# Patient Record
Sex: Female | Born: 2000 | Race: White | Hispanic: No | Marital: Single | Smoking: Never smoker
Health system: Southern US, Community
[De-identification: ages and names within clinical notes are randomized; demographics above are authoritative.]

## PROBLEM LIST (undated history)

## (undated) DIAGNOSIS — F419 Anxiety disorder, unspecified: Secondary | ICD-10-CM

## (undated) DIAGNOSIS — K219 Gastro-esophageal reflux disease without esophagitis: Secondary | ICD-10-CM

## (undated) DIAGNOSIS — N390 Urinary tract infection, site not specified: Secondary | ICD-10-CM

## (undated) DIAGNOSIS — F32A Depression, unspecified: Secondary | ICD-10-CM

## (undated) DIAGNOSIS — F329 Major depressive disorder, single episode, unspecified: Secondary | ICD-10-CM

## (undated) HISTORY — DX: Gastro-esophageal reflux disease without esophagitis: K21.9

## (undated) HISTORY — DX: Major depressive disorder, single episode, unspecified: F32.9

## (undated) HISTORY — DX: Urinary tract infection, site not specified: N39.0

## (undated) HISTORY — DX: Anxiety disorder, unspecified: F41.9

## (undated) HISTORY — DX: Depression, unspecified: F32.A

## (undated) HISTORY — PX: NO PAST SURGERIES: SHX2092

---
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
## 2018-01-25 DIAGNOSIS — L906 Striae atrophicae: Secondary | ICD-10-CM | POA: Insufficient documentation

## 2018-04-25 ENCOUNTER — Ambulatory Visit
Admission: EM | Admit: 2018-04-25 | Discharge: 2018-04-25 | Disposition: A | Discharge status: Home / Self Care | Payer: BLUE CROSS/BLUE SHIELD | Attending: Family Medicine | Admitting: Family Medicine

## 2018-04-25 ENCOUNTER — Encounter: Payer: Self-pay | Admitting: Emergency Medicine

## 2018-04-25 ENCOUNTER — Other Ambulatory Visit: Payer: Self-pay

## 2018-04-25 DIAGNOSIS — N3001 Acute cystitis with hematuria: Secondary | ICD-10-CM

## 2018-04-25 DIAGNOSIS — B9689 Other specified bacterial agents as the cause of diseases classified elsewhere: Secondary | ICD-10-CM

## 2018-04-25 LAB — URINALYSIS, COMPLETE (UACMP) WITH MICROSCOPIC
Glucose, UA: NEGATIVE mg/dL
Nitrite: NEGATIVE
Protein, ur: 100 mg/dL — AB
RBC / HPF: >50 RBC/hpf (ref 0–5)
Specific Gravity, Urine: >1.03 — ABNORMAL HIGH (ref 1.005–1.030)
WBC, UA: >50 WBC/hpf (ref 0–5)
pH: 5 (ref 5.0–8.0)

## 2018-04-25 MED ORDER — NITROFURANTOIN MONOHYD MACRO 100 MG PO CAPS: 100.0000 mg | ORAL_CAPSULE | Freq: Two times a day (BID) | ORAL | 0 refills | Status: DC

## 2018-04-25 MED ORDER — ONDANSETRON 4 MG PO TBDP: 4.0000 mg | ORAL_TABLET | Freq: Three times a day (TID) | ORAL | 0 refills | Status: DC | PRN

## 2018-04-25 NOTE — ED Provider Notes (Signed)
[facility]    CSN: [id number] Arrival date & time: 04/25/18  1340  History   Chief Complaint Chief Complaint  Patient presents with  . Dysuria  . Urinary Frequency   HPI  18 year old female presents with concerns for UTI.  Patient states her symptoms started last night.  She reports dysuria, urinary frequency and urgency.  Patient also reports cramping in her lower abdomen.  She states that she is currently on her menstrual cycle.  She states that her urine is malodorous.  No fever.  No chills.  No nausea or vomiting.  No known exacerbating or relieving factors.  No other associated symptoms.  No other complaints.  Past Medical History:  Diagnosis Date  . Acid reflux   . Anxiety   . Depression    Patient Active Problem List   Diagnosis Date Noted  . Dysmenorrhea 07/03/2016  . Anxiety and depression 06/27/2016  . Patient is Jehovah's Witness 11/11/2015  . Other constipation 11/04/2015  . Acid reflux 10/13/2015    Home Medications    Prior to Admission medications   Medication Sig Start Date End Date Taking? Authorizing Provider  buPROPion (WELLBUTRIN XL) 300 MG 24 hr tablet TAKE 1 TABLET (300 MG TOTAL) BY MOUTH NIGHTLY. 05/01/16  Yes [provider]  busPIRone (BUSPAR) 10 MG tablet TAKE 1 TABLET (10 MG TOTAL) BY MOUTH 2 (TWO) TIMES DAILY. 04/30/16  Yes [provider]  Norethindrone Acetate-Ethinyl Estrad-FE (LOESTRIN 24 FE) 1-20 MG-MCG(24) tablet Take 1 tablet by mouth daily. 07/03/16  Yes Reva BoresPratt, Tanya S, MD  nitrofurantoin, macrocrystal-monohydrate, (MACROBID) 100 MG capsule Take 1 capsule (100 mg total) by mouth 2 (two) times daily. 04/25/18   Tommie Samsook, Jenafer Winterton G, DO  ondansetron (ZOFRAN-ODT) 4 MG disintegrating tablet Take 1 tablet (4 mg total) by mouth every 8 (eight) hours as needed for nausea or vomiting. 04/25/18   Tommie Samsook, Zariah Cavendish G, DO    Family History Family History  Problem Relation Age of Onset  . Heart attack Maternal Grandmother   .  Hypertension Maternal Grandmother   . Hypertension Maternal Grandfather   . Endometriosis Mother   . Hypertension Father   . Hypertension Maternal Uncle   . Heart attack Maternal Uncle     Social History Social History   Tobacco Use  . Smoking status: Never Smoker  . Smokeless tobacco: Never Used  Substance Use Topics  . Alcohol use: No  . Drug use: No     Allergies   Patient has no known allergies.   Review of Systems Review of Systems  Constitutional: Negative for fever.  Gastrointestinal: Positive for abdominal pain and nausea.  Genitourinary: Positive for dysuria, frequency and urgency.   Physical Exam Triage Vital Signs ED Triage Vitals  Enc Vitals Group     BP 04/25/18 1347 (!) 140/92     Pulse Rate 04/25/18 1347 100     Resp 04/25/18 1347 18     Temp 04/25/18 1347 98.2 F (36.8 C)     Temp Source 04/25/18 1347 Oral     SpO2 04/25/18 1347 100 %     Weight 04/25/18 1348 120 lb (54.4 kg)     Height 04/25/18 1348 5\' 2"  (1.575 m)     Head Circumference --      Peak Flow --      Pain Score 04/25/18 1348 8     Pain Loc --      Pain Edu? --      Excl. in  GC? --    No data found.  Updated Vital Signs BP (!) 140/92 (BP Location: Left Arm)   Pulse 100   Temp 98.2 F (36.8 C) (Oral)   Resp 18   Ht 5\' 2"  (1.575 m)   Wt 54.4 kg   LMP 04/14/2018   SpO2 100%   BMI 21.95 kg/m   Visual Acuity Right Eye Distance:   Left Eye Distance:   Bilateral Distance:    Right Eye Near:   Left Eye Near:    Bilateral Near:     Physical Exam   UC Treatments / Results  Labs (all labs ordered are listed, but only abnormal results are displayed) Labs Reviewed  URINALYSIS, COMPLETE (UACMP) WITH MICROSCOPIC - Abnormal; Notable for the following components:      Result Value   Color, Urine AMBER (*)    APPearance CLOUDY (*)    Specific Gravity, Urine >1.030 (*)    Hgb urine dipstick LARGE (*)    Bilirubin Urine SMALL (*)    Ketones, ur TRACE (*)    Protein, ur  100 (*)    Leukocytes,Ua MODERATE (*)    Bacteria, UA FEW (*)    All other components within normal limits  URINE CULTURE    EKG None  Radiology No results found.  Procedures Procedures (including critical care time)  Medications Ordered in UC Medications - No data to display  Initial Impression / Assessment and Plan / UC Course  I have reviewed the triage vital signs and the nursing notes.  Pertinent labs & imaging results that were available during my care of the patient were reviewed by me and considered in my medical decision making (see chart for details).    18 year old female presents with UTI.  Sending culture.  Starting on Macrobid.  Patient requested medication for nausea.  Zofran sent.  Final Clinical Impressions(s) / UC Diagnoses   Final diagnoses:  Acute cystitis with hematuria   Discharge Instructions   None    ED Prescriptions    Medication Sig Dispense Auth. Provider   nitrofurantoin, macrocrystal-monohydrate, (MACROBID) 100 MG capsule Take 1 capsule (100 mg total) by mouth 2 (two) times daily. 14 capsule Evan Mackie G, DO   ondansetron (ZOFRAN-ODT) 4 MG disintegrating tablet Take 1 tablet (4 mg total) by mouth every 8 (eight) hours as needed for nausea or vomiting. 20 tablet Tommie Sams, DO     Controlled Substance Prescriptions [place] Controlled Substance Registry consulted? Not Applicable   Tommie Sams, DO 04/25/18 1416

## 2018-04-25 NOTE — ED Triage Notes (Signed)
Patient c/o urinary frequency, urgency, dysuria that started yesterday.

## 2018-04-27 LAB — URINE CULTURE

## 2018-05-19 ENCOUNTER — Other Ambulatory Visit: Payer: Self-pay

## 2018-05-19 ENCOUNTER — Ambulatory Visit
Admission: EM | Admit: 2018-05-19 | Discharge: 2018-05-19 | Disposition: A | Discharge status: Home / Self Care | Payer: BLUE CROSS/BLUE SHIELD | Attending: Family Medicine | Admitting: Family Medicine

## 2018-05-19 ENCOUNTER — Encounter: Payer: Self-pay | Admitting: Emergency Medicine

## 2018-05-19 DIAGNOSIS — N390 Urinary tract infection, site not specified: Secondary | ICD-10-CM

## 2018-05-19 DIAGNOSIS — B9689 Other specified bacterial agents as the cause of diseases classified elsewhere: Secondary | ICD-10-CM | POA: Diagnosis not present

## 2018-05-19 LAB — URINALYSIS, COMPLETE (UACMP) WITH MICROSCOPIC
Glucose, UA: NEGATIVE mg/dL
Nitrite: NEGATIVE
Protein, ur: 100 mg/dL — AB
Specific Gravity, Urine: >1.03 — ABNORMAL HIGH (ref 1.005–1.030)
Squamous Epithelial / LPF: NONE SEEN (ref 0–5)
pH: 6.5 (ref 5.0–8.0)

## 2018-05-19 MED ORDER — CEPHALEXIN 500 MG PO CAPS: 500.0000 mg | ORAL_CAPSULE | Freq: Two times a day (BID) | ORAL | 0 refills | Status: DC

## 2018-05-19 NOTE — ED Provider Notes (Signed)
[facility]    CSN: [id number] Arrival date & time: 05/19/18  1550     History   Chief Complaint Chief Complaint  Patient presents with  . Urinary Frequency  . Urinary Urgency    HPI Claire Allen is a 18 y.o. female.    Urinary Frequency  This is a new problem. Pertinent negatives include no abdominal pain.  Dysuria  Pain quality:  Burning Pain severity:  Mild Onset quality:  Sudden Duration:  4 days Timing:  Constant Progression:  Worsening Chronicity:  New Recent urinary tract infections: yes (1 month ago)   Relieved by:  None tried Ineffective treatments:  None tried Urinary symptoms: frequent urination   Associated symptoms: no abdominal pain, no fever, no flank pain, no nausea, no vaginal discharge and no vomiting     Past Medical History:  Diagnosis Date  . Acid reflux   . Anxiety   . Depression     Patient Active Problem List   Diagnosis Date Noted  . Dysmenorrhea 07/03/2016  . Anxiety and depression 06/27/2016  . Patient is Jehovah's Witness 11/11/2015  . Other constipation 11/04/2015  . Acid reflux 10/13/2015    History reviewed. No pertinent surgical history.  OB History    Gravida  0   Para  0   Term  0   Preterm  0   AB  0   Living  0     SAB  0   TAB  0   Ectopic  0   Multiple  0   Live Births  0            Home Medications    Prior to Admission medications   Medication Sig Start Date End Date Taking? Authorizing Provider  buPROPion (WELLBUTRIN XL) 300 MG 24 hr tablet TAKE 1 TABLET (300 MG TOTAL) BY MOUTH NIGHTLY. 05/01/16  Yes [provider]  busPIRone (BUSPAR) 10 MG tablet TAKE 1 TABLET (10 MG TOTAL) BY MOUTH 2 (TWO) TIMES DAILY. 04/30/16  Yes [provider]  Norethindrone Acetate-Ethinyl Estrad-FE (LOESTRIN 24 FE) 1-20 MG-MCG(24) tablet Take 1 tablet by mouth daily. 07/03/16  Yes Reva BoresPratt, Tanya S, MD  cephALEXin (KEFLEX) 500 MG capsule Take 1 capsule (500 mg total) by mouth 2 (two)  times daily. 05/19/18   Payton Mccallumonty, Fransisca Shawn, MD  nitrofurantoin, macrocrystal-monohydrate, (MACROBID) 100 MG capsule Take 1 capsule (100 mg total) by mouth 2 (two) times daily. 04/25/18   Tommie Samsook, Jayce G, DO  ondansetron (ZOFRAN-ODT) 4 MG disintegrating tablet Take 1 tablet (4 mg total) by mouth every 8 (eight) hours as needed for nausea or vomiting. 04/25/18   Tommie Samsook, Jayce G, DO    Family History Family History  Problem Relation Age of Onset  . Heart attack Maternal Grandmother   . Hypertension Maternal Grandmother   . Hypertension Maternal Grandfather   . Endometriosis Mother   . Hypertension Father   . Hypertension Maternal Uncle   . Heart attack Maternal Uncle     Social History Social History   Tobacco Use  . Smoking status: Never Smoker  . Smokeless tobacco: Never Used  Substance Use Topics  . Alcohol use: No  . Drug use: No     Allergies   Patient has no known allergies.   Review of Systems Review of Systems  Constitutional: Negative for fever.  Gastrointestinal: Negative for abdominal pain, nausea and vomiting.  Genitourinary: Positive for dysuria and frequency. Negative for flank pain and vaginal discharge.  Physical Exam Triage Vital Signs ED Triage Vitals  Enc Vitals Group     BP 05/19/18 1603 101/82     Pulse Rate 05/19/18 1603 100     Resp 05/19/18 1603 18     Temp 05/19/18 1603 98.4 F (36.9 C)     Temp Source 05/19/18 1603 Oral     SpO2 05/19/18 1603 99 %     Weight 05/19/18 1604 120 lb (54.4 kg)     Height 05/19/18 1604 5\' 1"  (1.549 m)     Head Circumference --      Peak Flow --      Pain Score 05/19/18 1603 0     Pain Loc --      Pain Edu? --      Excl. in GC? --    No data found.  Updated Vital Signs BP 101/82 (BP Location: Left Arm)   Pulse 100   Temp 98.4 F (36.9 C) (Oral)   Resp 18   Ht 5\' 1"  (1.549 m)   Wt 54.4 kg   LMP 04/25/2018   SpO2 99%   BMI 22.67 kg/m   Visual Acuity Right Eye Distance:   Left Eye Distance:    Bilateral Distance:    Right Eye Near:   Left Eye Near:    Bilateral Near:     Physical Exam Vitals signs and nursing note reviewed.  Constitutional:      General: She is not in acute distress.    Appearance: She is well-developed. She is not diaphoretic.  Abdominal:     General: There is no distension.     Palpations: Abdomen is soft.      UC Treatments / Results  Labs (all labs ordered are listed, but only abnormal results are displayed) Labs Reviewed  URINALYSIS, COMPLETE (UACMP) WITH MICROSCOPIC - Abnormal; Notable for the following components:      Result Value   APPearance HAZY (*)    Specific Gravity, Urine >1.030 (*)    Hgb urine dipstick MODERATE (*)    Bilirubin Urine SMALL (*)    Ketones, ur TRACE (*)    Protein, ur 100 (*)    Leukocytes,Ua MODERATE (*)    Bacteria, UA RARE (*)    All other components within normal limits  URINE CULTURE    EKG None  Radiology No results found.  Procedures Procedures (including critical care time)  Medications Ordered in UC Medications - No data to display  Initial Impression / Assessment and Plan / UC Course  I have reviewed the triage vital signs and the nursing notes.  Pertinent labs & imaging results that were available during my care of the patient were reviewed by me and considered in my medical decision making (see chart for details).      Final Clinical Impressions(s) / UC Diagnoses   Final diagnoses:  Lower urinary tract infectious disease    ED Prescriptions    Medication Sig Dispense Auth. Provider   cephALEXin (KEFLEX) 500 MG capsule Take 1 capsule (500 mg total) by mouth 2 (two) times daily. 14 capsule Payton Mccallum, MD     1. Lab results and diagnosis reviewed with patient 2. rx as per orders above; reviewed possible side effects, interactions, risks and benefits  3. Recommend supportive treatment with increased fluids 4. Follow-up prn if symptoms worsen or don't improve    Controlled  Substance Prescriptions [place] Controlled Substance Registry consulted? Not Applicable   Payton Mccallum, MD 05/19/18 1754

## 2018-05-19 NOTE — ED Triage Notes (Signed)
Patient c/o urinary frequency and urgency that started 4 days ago.  

## 2018-05-21 LAB — URINE CULTURE: Culture: >=100000 — AB

## 2018-05-22 ENCOUNTER — Telehealth (HOSPITAL_COMMUNITY): Payer: Self-pay | Admitting: Emergency Medicine

## 2018-05-22 MED ORDER — SULFAMETHOXAZOLE-TRIMETHOPRIM 800-160 MG PO TABS: 1.0000 | ORAL_TABLET | Freq: Two times a day (BID) | ORAL | 0 refills | Status: AC

## 2018-05-22 NOTE — Telephone Encounter (Signed)
Patient contacted and made aware of all results, all questions answered.   

## 2018-05-22 NOTE — Telephone Encounter (Signed)
Reviewed chart with Dr. Delton See, culture shows resistance to keflex, will send in septra to pt pharmacy on record. Attempted to reach patient. No answer at this time. Voicemail left.

## 2018-07-04 ENCOUNTER — Ambulatory Visit
Admission: EM | Admit: 2018-07-04 | Discharge: 2018-07-04 | Disposition: A | Discharge status: Home / Self Care | Payer: BC Managed Care – PPO | Attending: Family Medicine | Admitting: Family Medicine

## 2018-07-04 ENCOUNTER — Other Ambulatory Visit: Payer: Self-pay

## 2018-07-04 DIAGNOSIS — R350 Frequency of micturition: Secondary | ICD-10-CM

## 2018-07-04 DIAGNOSIS — N390 Urinary tract infection, site not specified: Secondary | ICD-10-CM

## 2018-07-04 DIAGNOSIS — R319 Hematuria, unspecified: Secondary | ICD-10-CM

## 2018-07-04 LAB — URINALYSIS, COMPLETE (UACMP) WITH MICROSCOPIC
Bilirubin Urine: NEGATIVE
Glucose, UA: NEGATIVE mg/dL
Ketones, ur: NEGATIVE mg/dL
Nitrite: NEGATIVE
Protein, ur: NEGATIVE mg/dL
Specific Gravity, Urine: 1.02 (ref 1.005–1.030)
pH: 6.5 (ref 5.0–8.0)

## 2018-07-04 MED ORDER — NITROFURANTOIN MONOHYD MACRO 100 MG PO CAPS: 100.0000 mg | ORAL_CAPSULE | Freq: Two times a day (BID) | ORAL | 0 refills | Status: AC

## 2018-07-04 NOTE — Discharge Instructions (Addendum)
Take medication as prescribed. Rest. Drink plenty of fluids.   Follow up with your primary care physician or the above.  Return to Urgent care for new or worsening concerns.

## 2018-07-04 NOTE — ED Triage Notes (Signed)
Patient complains of painful urination with frequency. States that this started 2-3 days ago.

## 2018-07-04 NOTE — ED Provider Notes (Signed)
[facility] ____________________________________________  Time seen: Approximately 9:11 AM  I have reviewed the triage vital signs and the nursing notes.   HISTORY  Chief Complaint Dysuria   HPI Claire Allen is a 18 y.o. female presenting for evaluation of 3 days of urinary frequency, urinary urgency and burning with urination.  Patient reports this feels similar to her previous UTI that she has had.  States she has never really had UTIs until recently.  Denies vaginal discharge or vaginal complaints.  Denies pregnancy.  Continues eat and drink well.  Denies abdominal pain, back pain, fevers, vomiting or diarrhea.  No recent sickness, fevers, cough or congestion.  Denies aggravating or alleviating factors.  [place]: PCP    Past Medical History:  Diagnosis Date  . Acid reflux   . Anxiety   . Depression     Patient Active Problem List   Diagnosis Date Noted  . Dysmenorrhea 07/03/2016  . Anxiety and depression 06/27/2016  . Patient is Jehovah's Witness 11/11/2015  . Other constipation 11/04/2015  . Acid reflux 10/13/2015    Past Surgical History:  Procedure Laterality Date  . NO PAST SURGERIES       No current facility-administered medications for this encounter.   Current Outpatient Medications:  .  buPROPion (WELLBUTRIN XL) 300 MG 24 hr tablet, TAKE 1 TABLET (300 MG TOTAL) BY MOUTH NIGHTLY., Disp: , Rfl: 3 .  hydrOXYzine (VISTARIL) 25 MG capsule, , Disp: , Rfl:  .  Norethindrone Acetate-Ethinyl Estrad-FE (LOESTRIN 24 FE) 1-20 MG-MCG(24) tablet, Take 1 tablet by mouth daily., Disp: 3 Package, Rfl: 5 .  busPIRone (BUSPAR) 10 MG tablet, TAKE 1 TABLET (10 MG TOTAL) BY MOUTH 2 (TWO) TIMES DAILY., Disp: , Rfl: 3 .  cephALEXin (KEFLEX) 500 MG capsule, Take 1 capsule (500 mg total) by mouth 2 (two) times daily., Disp: 14 capsule, Rfl: 0 .  nitrofurantoin, macrocrystal-monohydrate, (MACROBID) 100 MG capsule, Take 1 capsule (100 mg total) by  mouth 2 (two) times daily for 7 days., Disp: 14 capsule, Rfl: 0 .  ondansetron (ZOFRAN-ODT) 4 MG disintegrating tablet, Take 1 tablet (4 mg total) by mouth every 8 (eight) hours as needed for nausea or vomiting., Disp: 20 tablet, Rfl: 0  Allergies Patient has no known allergies.  Family History  Problem Relation Age of Onset  . Heart attack Maternal Grandmother   . Hypertension Maternal Grandmother   . Hypertension Maternal Grandfather   . Endometriosis Mother   . Hypertension Father   . Hypertension Maternal Uncle   . Heart attack Maternal Uncle     Social History Social History   Tobacco Use  . Smoking status: Never Smoker  . Smokeless tobacco: Never Used  Substance Use Topics  . Alcohol use: No  . Drug use: No    Review of Systems Constitutional: No fever ENT: No sore throat. Cardiovascular: Denies chest pain. Respiratory: Denies shortness of breath. Gastrointestinal: No abdominal pain.  No nausea, no vomiting.  No diarrhea.   Genitourinary: Positive for dysuria. Musculoskeletal: Negative for back pain. Skin: Negative for rash.   ____________________________________________   PHYSICAL EXAM:  VITAL SIGNS: ED Triage Vitals  Enc Vitals Group     BP 07/04/18 0832 113/74     Pulse Rate 07/04/18 0832 88     Resp 07/04/18 0832 16     Temp 07/04/18 0832 (!) 97.5 F (36.4 C)     Temp Source 07/04/18 0832 Oral     SpO2 07/04/18 0832 100 %  Weight 07/04/18 0830 120 lb (54.4 kg)     Height 07/04/18 0830 5\' 2"  (1.575 m)     Head Circumference --      Peak Flow --      Pain Score 07/04/18 0830 7     Pain Loc --      Pain Edu? --      Excl. in GC? --     Constitutional: Alert and oriented. Well appearing and in no acute distress. ENT      Head: Normocephalic and atraumatic. Cardiovascular: Normal rate, regular rhythm. Grossly normal heart sounds.  Good peripheral circulation. Respiratory: Normal respiratory effort without tachypnea nor retractions. Breath  sounds are clear and equal bilaterally. No wheezes, rales, rhonchi. Gastrointestinal: Soft and nontender. No CVA tenderness. Musculoskeletal: No midline cervical, thoracic or lumbar tenderness to palpation.  Neurologic:  Normal speech and language.Speech is normal. No gait instability.  Skin:  Skin is warm, dry  Psychiatric: Mood and affect are normal. Speech and behavior are normal. Patient exhibits appropriate insight and judgment   ___________________________________________   LABS (all labs ordered are listed, but only abnormal results are displayed)  Labs Reviewed  URINALYSIS, COMPLETE (UACMP) WITH MICROSCOPIC - Abnormal; Notable for the following components:      Result Value   Color, Urine STRAW (*)    APPearance HAZY (*)    Hgb urine dipstick SMALL (*)    Leukocytes,Ua MODERATE (*)    Bacteria, UA FEW (*)    All other components within normal limits  URINE CULTURE   ____________________________________________  PROCEDURES Procedures   INITIAL IMPRESSION / ASSESSMENT AND PLAN / ED COURSE  Pertinent labs & imaging results that were available during my care of the patient were reviewed by me and considered in my medical decision making (see chart for details).  Well-appearing patient.  No acute distress.  Urinalysis reviewed, suspect UTI.  We will culture.  Patient most recently treated with Bactrim, will treat with Macrobid.  Fluids, supportive care and recommend follow-up with primary care or urology.Discussed indication, risks and benefits of medications with patient.  Discussed follow up with Primary care physician this week. Discussed follow up and return parameters including no resolution or any worsening concerns. Patient verbalized understanding and agreed to plan.   ____________________________________________   FINAL CLINICAL IMPRESSION(S) / ED DIAGNOSES  Final diagnoses:  Urinary tract infection with hematuria, site unspecified     ED Discharge Orders          Ordered    nitrofurantoin, macrocrystal-monohydrate, (MACROBID) 100 MG capsule  2 times daily     07/04/18 [id number]           Note: This dictation was prepared with Dragon dictation along with smaller phrase technology. Any transcriptional errors that result from this process are unintentional.         Renford DillsMiller, Detric Scalisi, NP 07/04/18 [phone]

## 2018-07-06 LAB — URINE CULTURE: Culture: 50000 — AB

## 2018-07-07 ENCOUNTER — Telehealth (HOSPITAL_COMMUNITY): Payer: Self-pay | Admitting: Emergency Medicine

## 2018-07-07 MED ORDER — ONDANSETRON 4 MG PO TBDP: 4.0000 mg | ORAL_TABLET | Freq: Three times a day (TID) | ORAL | 0 refills | Status: DC | PRN

## 2018-07-07 NOTE — Telephone Encounter (Signed)
Treated with macrobid, contacted patient to let her know. Pt c/o nausea while taking medicine. Okay to send zofran per dr. Meda Coffee. All questions answered.

## 2018-09-26 ENCOUNTER — Encounter: Payer: Self-pay | Admitting: Emergency Medicine

## 2018-09-26 ENCOUNTER — Other Ambulatory Visit: Payer: Self-pay

## 2018-09-26 ENCOUNTER — Ambulatory Visit
Admission: EM | Admit: 2018-09-26 | Discharge: 2018-09-26 | Disposition: A | Discharge status: Home / Self Care | Payer: BC Managed Care – PPO | Attending: Family Medicine | Admitting: Family Medicine

## 2018-09-26 DIAGNOSIS — N390 Urinary tract infection, site not specified: Secondary | ICD-10-CM

## 2018-09-26 LAB — URINALYSIS, COMPLETE (UACMP) WITH MICROSCOPIC
Glucose, UA: NEGATIVE mg/dL
Ketones, ur: NEGATIVE mg/dL
Nitrite: NEGATIVE
Protein, ur: 30 mg/dL — AB
RBC / HPF: >50 RBC/hpf (ref 0–5)
Specific Gravity, Urine: >1.03 — ABNORMAL HIGH (ref 1.005–1.030)
WBC, UA: >50 WBC/hpf (ref 0–5)
pH: 5 (ref 5.0–8.0)

## 2018-09-26 MED ORDER — CEPHALEXIN 500 MG PO CAPS: 500.0000 mg | ORAL_CAPSULE | Freq: Two times a day (BID) | ORAL | 0 refills | Status: DC

## 2018-09-26 NOTE — ED Provider Notes (Signed)
[facility]    CSN: [id number] Arrival date & time: 09/26/18  1232      History   Chief Complaint Chief Complaint  Patient presents with  . Dysuria    HPI Claire Allen is a 18 y.o. female.   18 yo female with a c/o burning with urination and frequent urination since last night. Denies any fevers, chills, vomiting, abdominal pain.    Dysuria   Past Medical History:  Diagnosis Date  . Acid reflux   . Anxiety   . Depression     Patient Active Problem List   Diagnosis Date Noted  . Dysmenorrhea 07/03/2016  . Anxiety and depression 06/27/2016  . Patient is Jehovah's Witness 11/11/2015  . Other constipation 11/04/2015  . Acid reflux 10/13/2015    Past Surgical History:  Procedure Laterality Date  . NO PAST SURGERIES      OB History    Gravida  0   Para  0   Term  0   Preterm  0   AB  0   Living  0     SAB  0   TAB  0   Ectopic  0   Multiple  0   Live Births  0            Home Medications    Prior to Admission medications   Medication Sig Start Date End Date Taking? Authorizing Provider  buPROPion (WELLBUTRIN XL) 300 MG 24 hr tablet TAKE 1 TABLET (300 MG TOTAL) BY MOUTH NIGHTLY. 05/01/16  Yes [provider]  busPIRone (BUSPAR) 10 MG tablet TAKE 1 TABLET (10 MG TOTAL) BY MOUTH 2 (TWO) TIMES DAILY. 04/30/16  Yes [provider]  hydrOXYzine (VISTARIL) 25 MG capsule  05/21/18  Yes [provider]  Norethindrone Acetate-Ethinyl Estrad-FE (LOESTRIN 24 FE) 1-20 MG-MCG(24) tablet Take 1 tablet by mouth daily. 07/03/16  Yes Donnamae Jude, MD  cephALEXin (KEFLEX) 500 MG capsule Take 1 capsule (500 mg total) by mouth 2 (two) times daily. 09/26/18   Norval Gable, MD  ondansetron (ZOFRAN ODT) 4 MG disintegrating tablet Take 1 tablet (4 mg total) by mouth every 8 (eight) hours as needed for nausea or vomiting. 07/07/18   Raylene Everts, MD  ondansetron (ZOFRAN-ODT) 4 MG disintegrating tablet Take 1 tablet (4 mg  total) by mouth every 8 (eight) hours as needed for nausea or vomiting. 04/25/18   Coral Spikes, DO    Family History Family History  Problem Relation Age of Onset  . Heart attack Maternal Grandmother   . Hypertension Maternal Grandmother   . Hypertension Maternal Grandfather   . Endometriosis Mother   . Hypertension Father   . Hypertension Maternal Uncle   . Heart attack Maternal Uncle     Social History Social History   Tobacco Use  . Smoking status: Never Smoker  . Smokeless tobacco: Never Used  Substance Use Topics  . Alcohol use: No  . Drug use: No     Allergies   Patient has no known allergies.   Review of Systems Review of Systems  Genitourinary: Positive for dysuria.     Physical Exam Triage Vital Signs ED Triage Vitals  Enc Vitals Group     BP 09/26/18 1242 119/89     Pulse Rate 09/26/18 1242 98     Resp 09/26/18 1242 14     Temp 09/26/18 1242 98.5 F (36.9 C)     Temp Source 09/26/18 1242 Oral  SpO2 09/26/18 1242 100 %     Weight 09/26/18 1240 123 lb (55.8 kg)     Height 09/26/18 1240 5\' 1"  (1.549 m)     Head Circumference --      Peak Flow --      Pain Score 09/26/18 1239 8     Pain Loc --      Pain Edu? --      Excl. in GC? --    No data found.  Updated Vital Signs BP 119/89 (BP Location: Left Arm)   Pulse 98   Temp 98.5 F (36.9 C) (Oral)   Resp 14   Ht 5\' 1"  (1.549 m)   Wt 55.8 kg   LMP 09/12/2018 (Approximate)   SpO2 100%   BMI 23.24 kg/m   Visual Acuity Right Eye Distance:   Left Eye Distance:   Bilateral Distance:    Right Eye Near:   Left Eye Near:    Bilateral Near:     Physical Exam Vitals signs and nursing note reviewed.  Constitutional:      General: She is not in acute distress.    Appearance: She is not toxic-appearing or diaphoretic.  Abdominal:     General: There is no distension.  Neurological:     Mental Status: She is alert.      UC Treatments / Results  Labs (all labs ordered are listed,  but only abnormal results are displayed) Labs Reviewed  URINALYSIS, COMPLETE (UACMP) WITH MICROSCOPIC - Abnormal; Notable for the following components:      Result Value   APPearance CLOUDY (*)    Specific Gravity, Urine >1.030 (*)    Hgb urine dipstick LARGE (*)    Bilirubin Urine SMALL (*)    Protein, ur 30 (*)    Leukocytes,Ua SMALL (*)    Bacteria, UA MANY (*)    All other components within normal limits  URINE CULTURE    EKG   Radiology No results found.  Procedures Procedures (including critical care time)  Medications Ordered in UC Medications - No data to display  Initial Impression / Assessment and Plan / UC Course  I have reviewed the triage vital signs and the nursing notes.  Pertinent labs & imaging results that were available during my care of the patient were reviewed by me and considered in my medical decision making (see chart for details).      Final Clinical Impressions(s) / UC Diagnoses   Final diagnoses:  Lower urinary tract infectious disease     Discharge Instructions     Increase water intake    ED Prescriptions    Medication Sig Dispense Auth. Provider   cephALEXin (KEFLEX) 500 MG capsule Take 1 capsule (500 mg total) by mouth 2 (two) times daily. 14 capsule , MD      1. Lab results and diagnosis reviewed with patient 2. rx as per orders above; reviewed possible side effects, interactions, risks and benefits  3. Recommend supportive treatment as above 4. Follow-up prn if symptoms worsen or don't improve   PDMP not reviewed this encounter.   11/12/2018, MD 09/26/18 [id number]

## 2018-09-26 NOTE — Discharge Instructions (Signed)
Increase water intake

## 2018-09-26 NOTE — ED Triage Notes (Signed)
Patient c/o burning when urinating and urinary frequency that started last night.  Patient denies fevers.

## 2018-09-28 LAB — URINE CULTURE: Special Requests: NORMAL

## 2019-02-06 ENCOUNTER — Ambulatory Visit
Admission: EM | Admit: 2019-02-06 | Discharge: 2019-02-06 | Disposition: A | Discharge status: Home / Self Care | Payer: BC Managed Care – PPO | Attending: Family Medicine | Admitting: Family Medicine

## 2019-02-06 ENCOUNTER — Other Ambulatory Visit: Payer: Self-pay

## 2019-02-06 ENCOUNTER — Encounter: Payer: Self-pay | Admitting: Emergency Medicine

## 2019-02-06 DIAGNOSIS — N3001 Acute cystitis with hematuria: Secondary | ICD-10-CM | POA: Diagnosis not present

## 2019-02-06 LAB — URINALYSIS, COMPLETE (UACMP) WITH MICROSCOPIC
Bilirubin Urine: NEGATIVE
Glucose, UA: NEGATIVE mg/dL
Ketones, ur: NEGATIVE mg/dL
Nitrite: NEGATIVE
Protein, ur: NEGATIVE mg/dL
RBC / HPF: >50 RBC/hpf (ref 0–5)
Specific Gravity, Urine: >1.03 — ABNORMAL HIGH (ref 1.005–1.030)
WBC, UA: >50 WBC/hpf (ref 0–5)
pH: 5.5 (ref 5.0–8.0)

## 2019-02-06 MED ORDER — PHENAZOPYRIDINE HCL 200 MG PO TABS: 200.0000 mg | ORAL_TABLET | Freq: Three times a day (TID) | ORAL | 0 refills | Status: DC | PRN

## 2019-02-06 MED ORDER — NITROFURANTOIN MONOHYD MACRO 100 MG PO CAPS: 100.0000 mg | ORAL_CAPSULE | Freq: Two times a day (BID) | ORAL | 0 refills | Status: DC

## 2019-02-06 NOTE — Discharge Instructions (Addendum)
It was very nice seeing you today in clinic. Thank you for entrusting me with your care.   As discussed, your urine is POSITIVE for infection. Will approach treatment as follows:  Prescription has been sent to your pharmacy for antibiotics.  Please pick up and take as directed. FINISH the entire course of medication even if you are feeling better.  A culture will be sent on your provided sample. If it comes back resistant to what I have prescribed you, someone will call you and let you know that we will need to change antibiotics. Increase fluid intake as much as possible to flush your urinary tract.  Water is always the best.  Avoid caffeine until your infection clears up, as it can contribute to painful bladder spasms.  May use Tylenol and/or Ibuprofen as needed for pain/fever. I have sent the referral to the urologist. They should call you. If you don't hear from them soon, please call them. I have provided the number.  Make arrangements to follow up with your regular doctor in 1 week for re-evaluation. If your symptoms/condition worsens, please seek follow up care either here or in the ER. Please remember, our [facility] providers are "right here with you" when you need [place].   Again, it was my pleasure to take care of you today. Thank you for choosing our clinic. I hope that you start to feel better quickly.   Quentin Mulling, MSN, APRN, FNP-C, CEN Advanced Practice Provider [facility]

## 2019-02-06 NOTE — ED Provider Notes (Signed)
[place], [place]   Name: Claire Allen DOB: 13-Apr-2000 MRN: [id number] CSN: [id number] PCP: [facility]  Arrival date and time:  02/06/19 1026  Chief Complaint:  Dysuria   NOTE: Prior to seeing the patient today, I have reviewed the triage nursing documentation and vital signs. Clinical staff has updated patient's PMH/PSHx, current medication list, and drug allergies/intolerances to ensure comprehensive history available to assist in medical decision making.   History:   HPI: Claire Allen is a 19 y.o. female who presents today with complaints of urinary symptoms that began with acute onset 2 days ago. She complains of dysuria, frequency, and urgency. She has appreciated gross hematuria. Urine reported to be cloudy, however patient has not noticed it being malodorous. Patient denies any associated nausea, vomiting, fever, or chills. She has not experienced any pain in her lower back or flank areas. She does however note pain in the suprapubic area of her abdomen. Patient advises that she does has a past medical history that is significant for recurrent urinary tract infections. She denies any vaginal pain, bleeding, or discharge. There is no concern for STI. Patient's last menstrual period was 01/27/2019 (approximate). There are no concerns that she is currently pregnant. Of note, patient was seen by PCP earlier this morning for the same. Citing dissatisfaction with care, patient presents to urgent care for further evaluation and treatment. Patient is requesting to be referred to urology to discuss management of her recurrent urinary tract infections.   Past Medical History:  Diagnosis Date  . Acid reflux   . Anxiety   . Depression     Past Surgical History:  Procedure Laterality Date  . NO PAST SURGERIES      Family History  Problem Relation Age of Onset  . Heart attack Maternal Grandmother   . Hypertension Maternal Grandmother   . Hypertension Maternal Grandfather   .  Endometriosis Mother   . Hypertension Father   . Hypertension Maternal Uncle   . Heart attack Maternal Uncle     Social History   Tobacco Use  . Smoking status: Never Smoker  . Smokeless tobacco: Never Used  Substance Use Topics  . Alcohol use: No  . Drug use: No    Patient Active Problem List   Diagnosis Date Noted  . Dysmenorrhea 07/03/2016  . Anxiety and depression 06/27/2016  . Patient is Jehovah's Witness 11/11/2015  . Other constipation 11/04/2015  . Acid reflux 10/13/2015    Home Medications:    Current Meds  Medication Sig  . buPROPion (WELLBUTRIN XL) 300 MG 24 hr tablet TAKE 1 TABLET (300 MG TOTAL) BY MOUTH NIGHTLY.  . hydrOXYzine (VISTARIL) 25 MG capsule   . Norethindrone Acetate-Ethinyl Estrad-FE (LOESTRIN 24 FE) 1-20 MG-MCG(24) tablet Take 1 tablet by mouth daily.    Allergies:   Patient has no known allergies.  Review of Systems (ROS): Review of Systems  Constitutional: Negative for chills and fever.  Respiratory: Negative for cough and shortness of breath.   Cardiovascular: Negative for chest pain and palpitations.  Gastrointestinal: Positive for abdominal pain. Negative for nausea and vomiting.  Genitourinary: Positive for dysuria, frequency, hematuria and urgency. Negative for decreased urine volume, pelvic pain, vaginal bleeding, vaginal discharge and vaginal pain.  Musculoskeletal: Negative for back pain.  Skin: Negative for color change, pallor and rash.  Neurological: Negative for dizziness, syncope, weakness and headaches.  All other systems reviewed and are negative.    Vital Signs: Today's Vitals   02/06/19 1038 02/06/19 1041  02/06/19 1126  BP:  (!) 117/93   Pulse:  99   Resp:  14   Temp:  98.2 F (36.8 C)   TempSrc:  Oral   SpO2:  100%   Weight: 127 lb (57.6 kg)    Height: 5\' 1"  (1.549 m)    PainSc: 8   8     Physical Exam: Physical Exam  Constitutional: She is oriented to person, place, and time and well-developed,  well-nourished, and in no distress.  HENT:  Head: Normocephalic and atraumatic.  Eyes: Pupils are equal, round, and reactive to light.  Cardiovascular: Normal rate, regular rhythm, normal heart sounds and intact distal pulses.  Pulmonary/Chest: Effort normal and breath sounds normal.  Abdominal: Normal appearance and bowel sounds are normal. She exhibits no distension. There is abdominal tenderness in the suprapubic area. There is no CVA tenderness.  Neurological: She is alert and oriented to person, place, and time. Gait normal.  Skin: Skin is warm and dry. No rash noted. She is not diaphoretic.  Psychiatric: Mood, memory, affect and judgment normal.  Nursing note and vitals reviewed.   Urgent Care Treatments / Results:   Orders Placed This Encounter  Procedures  . Urine culture  . Urinalysis, Complete w Microscopic  . Ambulatory referral to Urology    LABS: PLEASE NOTE: all labs that were ordered this encounter are listed, however only abnormal results are displayed. Labs Reviewed  URINALYSIS, COMPLETE (UACMP) WITH MICROSCOPIC - Abnormal; Notable for the following components:      Result Value   APPearance CLOUDY (*)    Specific Gravity, Urine >1.030 (*)    Hgb urine dipstick MODERATE (*)    Leukocytes,Ua MODERATE (*)    Bacteria, UA FEW (*)    All other components within normal limits  URINE CULTURE    EKG: -None  RADIOLOGY: No results found.  PROCEDURES: Procedures  MEDICATIONS RECEIVED THIS VISIT: Medications - No data to display  PERTINENT CLINICAL COURSE NOTES/UPDATES:   Initial Impression / Assessment and Plan / Urgent Care Course:  Pertinent labs & imaging results that were available during my care of the patient were personally reviewed by me and considered in my medical decision making (see lab/imaging section of note for values and interpretations).  Claire Allen is a 19 y.o. female who presents to [facility] today with complaints of  Dysuria  Patient is well appearing overall in clinic today. She does not appear to be in any acute distress. Presenting symptoms (see HPI) and exam as documented above. Symptoms x 2 days. PMH (+) for recurrent infections, which is concerning to the patient. UA was (+) for infection today; reflex culture sent. Will treat with a 5 day course of nitrofurantoin. Patient encouraged to complete the entire course of antibiotics even if she begins to feel better. She was advised that if culture demonstrates resistance to the prescribed antibiotic, she will be contacted and advised of the need to change the antibiotic being used to treat her infection. Patient encouraged to increase her fluid intake as much as possible. Discussed that water is always best to flush the urinary tract. She was advised to avoid caffeine containing fluids until her infections clears, as caffeine can cause her to experience painful bladder spasms. May use Tylenol and/or Ibuprofen as needed for pain/fever. Will also send in a prescription for phenazopyridine to help relieve her current urinary pain. Referral entered into [facility] for patient to be seen at [facility] by [facility], PA-C. Name and  office contact information provided on today's AVS for provider's officer. Patient advised the she will be contacted by the office to schedule an appointment to be seen. In the event that she has not heard from them by Monday, patient encouraged to call the practice and request a new patient appointment.   I have reviewed the follow up and strict return precautions for any new or worsening symptoms. Patient is aware of symptoms that would be deemed urgent/emergent, and would thus require further evaluation either here or in the emergency department. At the time of discharge, she verbalized understanding and consent with the discharge plan as it was reviewed with her. All questions were fielded by provider and/or clinic staff prior to patient  discharge.    Final Clinical Impressions / Urgent Care Diagnoses:   Final diagnoses:  Acute cystitis with hematuria    New Prescriptions:  [place] Controlled Substance Registry consulted? Not Applicable  Meds ordered this encounter  Medications  . nitrofurantoin, macrocrystal-monohydrate, (MACROBID) 100 MG capsule    Sig: Take 1 capsule (100 mg total) by mouth 2 (two) times daily.    Dispense:  10 capsule    Refill:  0  . phenazopyridine (PYRIDIUM) 200 MG tablet    Sig: Take 1 tablet (200 mg total) by mouth 3 (three) times daily as needed for pain.    Dispense:  9 tablet    Refill:  0    Recommended Follow up Care:  Patient encouraged to follow up with the following provider within the specified time frame, or sooner as dictated by the severity of her symptoms. As always, she was instructed that for any urgent/emergent care needs, she should seek care either here or in the emergency department for more immediate evaluation.  Follow-up Information    [place] In 1 week.   Why: General reassessment of symptoms if not improving Contact information: [address] [place] [address] [place] [address] [phone]         NOTE: This note was prepared using Dragon dictation software along with smaller phrase technology. Despite my best ability to proofread, there is the potential that transcriptional errors may still occur from this process, and are completely unintentional.    Karen Kitchens, NP 02/06/19 1409

## 2019-02-06 NOTE — ED Triage Notes (Signed)
Patient c/o burning when urinating and urinary frequency that started 2 days ago.  Patient denies vaginal itching or discharge.

## 2019-02-08 LAB — URINE CULTURE: Culture: 80000 — AB

## 2019-03-03 ENCOUNTER — Other Ambulatory Visit: Payer: Self-pay

## 2019-03-03 DIAGNOSIS — N390 Urinary tract infection, site not specified: Secondary | ICD-10-CM

## 2019-03-05 NOTE — Progress Notes (Incomplete)
03/06/19 1:43 PM   [name] [birth year] [id number]  Referring provider: [facility] [address] [place],  [place] [address]  No chief complaint on file.   HPI: Claire Allen is a 19 yo white F who presents today for the evaluation and management of rUTIs.   + Urine cultures  02/06/2019 indicative of E..coli resistant to ampicillin, sulbactam, trimethoprim.  09/26/2018 indicative of multiple species present  07/04/2018  indicative of E..coli resistant to ampicillin, sulbactam, trimethoprim.  05/19/2018 indicative of S. Saprophyticus resistant to oxacillin 04/25/2018 indicative multiple species, most likely contamination    1. ***  *** 2. *** *** 3. *** ***    PMH: Past Medical History:  Diagnosis Date  . Acid reflux   . Anxiety   . Depression     Surgical History: Past Surgical History:  Procedure Laterality Date  . NO PAST SURGERIES      Home Medications:  Allergies as of 03/06/2019   No Known Allergies     Medication List       Accurate as of March 05, 2019  1:43 PM. If you have any questions, ask your nurse or doctor.        buPROPion 300 MG 24 hr tablet Commonly known as: WELLBUTRIN XL TAKE 1 TABLET (300 MG TOTAL) BY MOUTH NIGHTLY.   hydrOXYzine 25 MG capsule Commonly known as: VISTARIL   nitrofurantoin (macrocrystal-monohydrate) 100 MG capsule Commonly known as: MACROBID Take 1 capsule (100 mg total) by mouth 2 (two) times daily.   Norethindrone Acetate-Ethinyl Estrad-FE 1-20 MG-MCG(24) tablet Commonly known as: LOESTRIN 24 FE Take 1 tablet by mouth daily.   ondansetron 4 MG disintegrating tablet Commonly known as: ZOFRAN-ODT Take 1 tablet (4 mg total) by mouth every 8 (eight) hours as needed for nausea or vomiting.   ondansetron 4 MG disintegrating tablet Commonly known as: Zofran ODT Take 1 tablet (4 mg total) by mouth every 8 (eight) hours as needed for nausea or vomiting.   phenazopyridine 200 MG tablet Commonly known  as: PYRIDIUM Take 1 tablet (200 mg total) by mouth 3 (three) times daily as needed for pain.       Allergies: No Known Allergies  Family History: Family History  Problem Relation Age of Onset  . Heart attack Maternal Grandmother   . Hypertension Maternal Grandmother   . Hypertension Maternal Grandfather   . Endometriosis Mother   . Hypertension Father   . Hypertension Maternal Uncle   . Heart attack Maternal Uncle     Social History:  reports that she has never smoked. She has never used smokeless tobacco. She reports that she does not drink alcohol or use drugs.   Physical Exam: There were no vitals taken for this visit.  Constitutional:  Alert and oriented, No acute distress. HEENT: [place] AT, moist mucus membranes.  Trachea midline, no masses. Cardiovascular: No clubbing, cyanosis, or edema. Respiratory: Normal respiratory effort, no increased work of breathing. GI: Abdomen is soft, nontender, nondistended, no abdominal masses GU: No CVA tenderness Lymph: No cervical or inguinal lymphadenopathy. Skin: No rashes, bruises or suspicious lesions. Neurologic: Grossly intact, no focal deficits, moving all 4 extremities. Psychiatric: Normal mood and affect.  Laboratory Data:  Urinalysis    Component Value Date/Time   COLORURINE YELLOW 02/06/2019 1041   APPEARANCEUR CLOUDY (A) 02/06/2019 1041   APPEARANCEUR CLEAR 04/08/2013 1024   LABSPEC >1.030 (H) 02/06/2019 1041   LABSPEC >=1.030 04/08/2013 1024   PHURINE 5.5 02/06/2019 1041   GLUCOSEU  NEGATIVE 02/06/2019 1041   GLUCOSEU NEGATIVE 04/08/2013 1024   HGBUR MODERATE (A) 02/06/2019 [place] 02/06/2019 [place] 04/08/2013 1024   KETONESUR NEGATIVE 02/06/2019 1041   PROTEINUR NEGATIVE 02/06/2019 1041   NITRITE NEGATIVE 02/06/2019 1041   LEUKOCYTESUR MODERATE (A) 02/06/2019 1041   LEUKOCYTESUR TRACE 04/08/2013 1024    Lab Results  Component Value Date   BACTERIA FEW (A) 02/06/2019      Pertinent Imaging: ***  Assessment & Plan:    @DIAGMED @  No follow-ups on file.  [facility] [address] [place], [place] [address] [phone]  I, Lucas Mallow, am acting as a scribe for Dr. Hollice Espy,  {Add Scribe Attestation Statement}

## 2019-03-06 ENCOUNTER — Ambulatory Visit: Payer: BLUE CROSS/BLUE SHIELD | Admitting: Urology

## 2019-05-04 ENCOUNTER — Other Ambulatory Visit: Payer: Self-pay

## 2019-05-04 ENCOUNTER — Encounter: Payer: Self-pay | Admitting: Emergency Medicine

## 2019-05-04 ENCOUNTER — Ambulatory Visit
Admission: EM | Admit: 2019-05-04 | Discharge: 2019-05-04 | Disposition: A | Discharge status: Home / Self Care | Payer: BC Managed Care – PPO | Attending: Family Medicine | Admitting: Family Medicine

## 2019-05-04 DIAGNOSIS — N390 Urinary tract infection, site not specified: Secondary | ICD-10-CM | POA: Diagnosis present

## 2019-05-04 LAB — URINALYSIS, COMPLETE (UACMP) WITH MICROSCOPIC
Glucose, UA: NEGATIVE mg/dL
Ketones, ur: NEGATIVE mg/dL
Nitrite: NEGATIVE
Protein, ur: 30 mg/dL — AB
Specific Gravity, Urine: 1.02 (ref 1.005–1.030)
pH: 7.5 (ref 5.0–8.0)

## 2019-05-04 MED ORDER — ONDANSETRON 8 MG PO TBDP: 8.0000 mg | ORAL_TABLET | Freq: Three times a day (TID) | ORAL | 0 refills | Status: DC | PRN

## 2019-05-04 MED ORDER — CEPHALEXIN 500 MG PO CAPS: 500.0000 mg | ORAL_CAPSULE | Freq: Two times a day (BID) | ORAL | 0 refills | Status: DC

## 2019-05-04 NOTE — Discharge Instructions (Signed)
Increase water intake

## 2019-05-04 NOTE — ED Provider Notes (Signed)
[facility]    CSN: [id number] Arrival date & time: 05/04/19  1837      History   Chief Complaint Chief Complaint  Patient presents with  . Dysuria  . malodorous urine    HPI Claire Allen is a 19 y.o. female.   19 yo female with a c/o burning with urination and bad smelling urine for the past 2 days. Denies any fevers, chills, vomiting, flank pain. Has had some slight nausea.    Dysuria   Past Medical History:  Diagnosis Date  . Acid reflux   . Anxiety   . Depression     Patient Active Problem List   Diagnosis Date Noted  . Dysmenorrhea 07/03/2016  . Anxiety and depression 06/27/2016  . Patient is Jehovah's Witness 11/11/2015  . Other constipation 11/04/2015  . Acid reflux 10/13/2015    Past Surgical History:  Procedure Laterality Date  . NO PAST SURGERIES      OB History    Gravida  0   Para  0   Term  0   Preterm  0   AB  0   Living  0     SAB  0   TAB  0   Ectopic  0   Multiple  0   Live Births  0            Home Medications    Prior to Admission medications   Medication Sig Start Date End Date Taking? Authorizing Provider  buPROPion (WELLBUTRIN XL) 300 MG 24 hr tablet TAKE 1 TABLET (300 MG TOTAL) BY MOUTH NIGHTLY. 05/01/16  Yes [provider]  hydrOXYzine (VISTARIL) 25 MG capsule  05/21/18  Yes [provider]  Norethindrone Acetate-Ethinyl Estrad-FE (LOESTRIN 24 FE) 1-20 MG-MCG(24) tablet Take 1 tablet by mouth daily. 07/03/16  Yes Reva Bores, MD  cephALEXin (KEFLEX) 500 MG capsule Take 1 capsule (500 mg total) by mouth 2 (two) times daily. 05/04/19   Payton Mccallum, MD  nitrofurantoin, macrocrystal-monohydrate, (MACROBID) 100 MG capsule Take 1 capsule (100 mg total) by mouth 2 (two) times daily. 02/06/19   Verlee Monte, NP  ondansetron (ZOFRAN ODT) 8 MG disintegrating tablet Take 1 tablet (8 mg total) by mouth every 8 (eight) hours as needed. 05/04/19   Payton Mccallum, MD  phenazopyridine (PYRIDIUM)  200 MG tablet Take 1 tablet (200 mg total) by mouth 3 (three) times daily as needed for pain. 02/06/19   Verlee Monte, NP    Family History Family History  Problem Relation Age of Onset  . Heart attack Maternal Grandmother   . Hypertension Maternal Grandmother   . Hypertension Maternal Grandfather   . Endometriosis Mother   . Hypertension Father   . Hypertension Maternal Uncle   . Heart attack Maternal Uncle     Social History Social History   Tobacco Use  . Smoking status: Never Smoker  . Smokeless tobacco: Never Used  Substance Use Topics  . Alcohol use: No  . Drug use: No     Allergies   Patient has no known allergies.   Review of Systems Review of Systems  Genitourinary: Positive for dysuria.     Physical Exam Triage Vital Signs ED Triage Vitals  Enc Vitals Group     BP 05/04/19 1904 113/79     Pulse Rate 05/04/19 1904 99     Resp 05/04/19 1904 18     Temp 05/04/19 1904 98 F (36.7 C)     Temp Source  05/04/19 1904 Oral     SpO2 05/04/19 1904 100 %     Weight 05/04/19 1905 125 lb (56.7 kg)     Height 05/04/19 1905 5\' 1"  (1.549 m)     Head Circumference --      Peak Flow --      Pain Score 05/04/19 1904 7     Pain Loc --      Pain Edu? --      Excl. in [place]? --    No data found.  Updated Vital Signs BP 113/79 (BP Location: Left Arm)   Pulse 99   Temp 98 F (36.7 C) (Oral)   Resp 18   Ht 5\' 1"  (1.549 m)   Wt 56.7 kg   LMP 04/25/2019 (Approximate)   SpO2 100%   BMI 23.62 kg/m   Visual Acuity Right Eye Distance:   Left Eye Distance:   Bilateral Distance:    Right Eye Near:   Left Eye Near:    Bilateral Near:     Physical Exam Vitals and nursing note reviewed.  Constitutional:      General: She is not in acute distress.    Appearance: She is not toxic-appearing or diaphoretic.  Abdominal:     General: There is no distension.     Palpations: Abdomen is soft.     Tenderness: There is no abdominal tenderness.  Neurological:     Mental  Status: She is alert.      UC Treatments / Results  Labs (all labs ordered are listed, but only abnormal results are displayed) Labs Reviewed  URINALYSIS, COMPLETE (UACMP) WITH MICROSCOPIC - Abnormal; Notable for the following components:      Result Value   APPearance HAZY (*)    Hgb urine dipstick TRACE (*)    Bilirubin Urine SMALL (*)    Protein, ur 30 (*)    Leukocytes,Ua SMALL (*)    Bacteria, UA FEW (*)    All other components within normal limits  URINE CULTURE    EKG   Radiology No results found.  Procedures Procedures (including critical care time)  Medications Ordered in UC Medications - No data to display  Initial Impression / Assessment and Plan / UC Course  I have reviewed the triage vital signs and the nursing notes.  Pertinent labs & imaging results that were available during my care of the patient were reviewed by me and considered in my medical decision making (see chart for details).      Final Clinical Impressions(s) / UC Diagnoses   Final diagnoses:  Lower urinary tract infectious disease     Discharge Instructions     Increase water intake    ED Prescriptions    Medication Sig Dispense Auth. Provider   cephALEXin (KEFLEX) 500 MG capsule Take 1 capsule (500 mg total) by mouth 2 (two) times daily. 14 capsule Chon Buhl, MD   ondansetron (ZOFRAN ODT) 8 MG disintegrating tablet Take 1 tablet (8 mg total) by mouth every 8 (eight) hours as needed. 6 tablet Norval Gable, MD      1. Lab results and diagnosis reviewed with patient 2. rx as per orders above; reviewed possible side effects, interactions, risks and benefits  3. Recommend supportive treatment with increase water intake 4. Follow-up prn if symptoms worsen or don't improve   PDMP not reviewed this encounter.   Norval Gable, MD 05/04/19 1944

## 2019-05-04 NOTE — ED Triage Notes (Signed)
Patient in today c/o 2 day history of dysuria and malodorous urine. Patient has not used any OTC medications. Patient denies fever or vaginal discharge.

## 2019-05-06 LAB — URINE CULTURE
Culture: 30000 — AB
Special Requests: NORMAL

## 2019-06-02 ENCOUNTER — Encounter: Payer: Self-pay | Admitting: Emergency Medicine

## 2019-06-02 ENCOUNTER — Other Ambulatory Visit: Payer: Self-pay

## 2019-06-02 ENCOUNTER — Ambulatory Visit
Admission: EM | Admit: 2019-06-02 | Discharge: 2019-06-02 | Disposition: A | Discharge status: Home / Self Care | Payer: BC Managed Care – PPO | Attending: Family Medicine | Admitting: Family Medicine

## 2019-06-02 DIAGNOSIS — N390 Urinary tract infection, site not specified: Secondary | ICD-10-CM

## 2019-06-02 LAB — URINALYSIS, COMPLETE (UACMP) WITH MICROSCOPIC
Bilirubin Urine: NEGATIVE
Glucose, UA: NEGATIVE mg/dL
Ketones, ur: NEGATIVE mg/dL
Nitrite: NEGATIVE
Protein, ur: NEGATIVE mg/dL
Specific Gravity, Urine: >1.03 — ABNORMAL HIGH (ref 1.005–1.030)
WBC, UA: >50 WBC/hpf (ref 0–5)
pH: 6.5 (ref 5.0–8.0)

## 2019-06-02 MED ORDER — NITROFURANTOIN MONOHYD MACRO 100 MG PO CAPS: 100.0000 mg | ORAL_CAPSULE | Freq: Two times a day (BID) | ORAL | 0 refills | Status: DC

## 2019-06-02 MED ORDER — PHENAZOPYRIDINE HCL 200 MG PO TABS: 200.0000 mg | ORAL_TABLET | Freq: Three times a day (TID) | ORAL | 0 refills | Status: DC | PRN

## 2019-06-02 NOTE — ED Triage Notes (Signed)
Patient c/o foul urine smell, dysuria and urinary frequency that started 2 days ago.

## 2019-06-02 NOTE — Discharge Instructions (Signed)
It was very nice seeing you today in clinic. Thank you for entrusting me with your care.  ° °As discussed, your urine is POSITIVE for infection. Will approach treatment as follows: ° °Prescription has been sent to your pharmacy for antibiotics.  °Please pick up and take as directed. FINISH the entire course of medication even if you are feeling better.  °A culture will be sent on your provided sample. If it comes back resistant to what I have prescribed you, someone will call you and let you know that we will need to change antibiotics. °Increase fluid intake as much as possible to flush your urinary tract.  °Water is always the best.  °Avoid caffeine until your infection clears up, as it can contribute to painful bladder spasms.  °May use Tylenol and/or Ibuprofen as needed for pain/fever. ° °Make arrangements to follow up with your regular doctor in 1 week for re-evaluation. If your symptoms/condition worsens, please seek follow up care either here or in the ER. Please remember, our [facility] providers are "right here with you" when you need us.  ° °Again, it was my pleasure to take care of you today. Thank you for choosing our clinic. I hope that you start to feel better quickly.  ° °Chase Knebel, MSN, APRN, FNP-C, CEN °Advanced Practice Provider °[facility] [place] [facility] ° °

## 2019-06-04 NOTE — ED Provider Notes (Signed)
[place], [place]   Name: Claire Allen DOB: [birth year] MRN: [id number] CSN: [id number] PCP: [facility]  Arrival date and time:  06/02/19 1920  Chief Complaint:  Dysuria and Urinary Frequency  NOTE: Prior to seeing the patient today, I have reviewed the triage nursing documentation and vital signs. Clinical staff has updated patient's PMH/PSHx, current medication list, and drug allergies/intolerances to ensure comprehensive history available to assist in medical decision making.   History:   HPI: Claire Allen is a 19 y.o. female who presents today with complaints of urinary symptoms that began with acute onset 4 days ago. She complains of dysuria, frequency, and urgency. She has not appreciated any gross hematuria. She has noticed her urine being malodorous. Patient denies any associated nausea, vomiting, fever, or chills. She has experienced pain in her lower back, flank area, and abdomen. Patient advises that she does not have a past medical history that is significant for recurrent urinary tract infections. She denies any vaginal pain, bleeding, or discharge. Patient's last menstrual period was 05/31/2019. There are no concerns that she is currently pregnant.   Past Medical History:  Diagnosis Date  . Acid reflux   . Anxiety   . Depression     Past Surgical History:  Procedure Laterality Date  . NO PAST SURGERIES      Family History  Problem Relation Age of Onset  . Heart attack Maternal Grandmother   . Hypertension Maternal Grandmother   . Hypertension Maternal Grandfather   . Endometriosis Mother   . Hypertension Father   . Hypertension Maternal Uncle   . Heart attack Maternal Uncle     Social History   Tobacco Use  . Smoking status: Never Smoker  . Smokeless tobacco: Never Used  Substance Use Topics  . Alcohol use: No  . Drug use: No    Patient Active Problem List   Diagnosis Date Noted  . Dysmenorrhea 07/03/2016  . Anxiety and depression  06/27/2016  . Patient is Jehovah's Witness 11/11/2015  . Other constipation 11/04/2015  . Acid reflux 10/13/2015    Home Medications:    Current Meds  Medication Sig  . buPROPion (WELLBUTRIN XL) 300 MG 24 hr tablet TAKE 1 TABLET (300 MG TOTAL) BY MOUTH NIGHTLY.  . hydrOXYzine (VISTARIL) 25 MG capsule   . Norethindrone Acetate-Ethinyl Estrad-FE (LOESTRIN 24 FE) 1-20 MG-MCG(24) tablet Take 1 tablet by mouth daily.    Allergies:   Patient has no known allergies.  Review of Systems (ROS):  Review of systems NEGATIVE unless otherwise noted in narrative H&P section.   Vital Signs: Today's Vitals   06/02/19 1934 06/02/19 1937 06/02/19 2000  BP:  113/83   Pulse:  91   Resp:  18   Temp:  98.4 F (36.9 C)   TempSrc:  Oral   SpO2:  100%   Weight: 125 lb (56.7 kg)    Height: 5\' 2"  (1.575 m)    PainSc: 0-No pain  0-No pain    Physical Exam: Physical Exam  Constitutional: She is oriented to person, place, and time and well-developed, well-nourished, and in no distress.  HENT:  Head: Normocephalic and atraumatic.  Eyes: Pupils are equal, round, and reactive to light.  Cardiovascular: Normal rate, regular rhythm, normal heart sounds and intact distal pulses.  Pulmonary/Chest: Effort normal and breath sounds normal.  Abdominal: Soft. Normal appearance and bowel sounds are normal. She exhibits no distension. There is abdominal tenderness in the suprapubic area. There is CVA tenderness (mild; BILATERAL).  Neurological: She is alert and oriented to person, place, and time. Gait normal.  Skin: Skin is warm and dry. No rash noted. She is not diaphoretic.  Psychiatric: Mood, memory, affect and judgment normal.  Nursing note and vitals reviewed.   Urgent Care Treatments / Results:   Orders Placed This Encounter  Procedures  . Urinalysis, Complete w Microscopic  . Ambulatory referral to Urology    LABS: PLEASE NOTE: all labs that were ordered this encounter are listed, however  only abnormal results are displayed. Labs Reviewed  URINALYSIS, COMPLETE (UACMP) WITH MICROSCOPIC - Abnormal; Notable for the following components:      Result Value   Specific Gravity, Urine >1.030 (*)    Hgb urine dipstick TRACE (*)    Leukocytes,Ua TRACE (*)    Non Squamous Epithelial PRESENT (*)    Bacteria, UA FEW (*)    All other components within normal limits    EKG: -None  RADIOLOGY: No results found.  PROCEDURES: Procedures  MEDICATIONS RECEIVED THIS VISIT: Medications - No data to display  PERTINENT CLINICAL COURSE NOTES/UPDATES:   Initial Impression / Assessment and Plan / Urgent Care Course:  Pertinent labs & imaging results that were available during my care of the patient were personally reviewed by me and considered in my medical decision making (see lab/imaging section of note for values and interpretations).  Claire Allen is a 19 y.o. female who presents to [facility] today with complaints of Dysuria and Urinary Frequency  Patient is well appearing overall in clinic today. She does not appear to be in any acute distress. Presenting symptoms (see HPI) and exam as documented above.  Marland Kitchen UA was (+) for infection; reflex culture sent.  o Will treat with a 5 day course of nitrofurantoin. Patient encouraged to complete the entire course of antibiotics even if she begins to feel better. She was advised that if culture demonstrates resistance to the prescribed antibiotic, she will be contacted and advised of the need to change the antibiotic being used to treat her infection.   o Patient encouraged to increase her fluid intake as much as possible. Discussed that water is always best to flush the urinary tract. She was advised to avoid caffeine containing fluids until her infections clears, as caffeine can cause her to experience painful bladder spasms.   o May use Tylenol and/or Ibuprofen as needed for pain/fever. Will also send phenazopyridine to help relieve  her current urinary pain.   o Patient previously referred to urology, however when contacted she declined the visit citing that she had decided to hold off. Patient presents today asking to be referred once again. She states, "I promise that I will go this time. I am tired of this. I need to find out what is going on and why I keep getting infections".   Discussed follow up with primary care physician in 1 week for re-evaluation. I have reviewed the follow up and strict return precautions for any new or worsening symptoms. Patient is aware of symptoms that would be deemed urgent/emergent, and would thus require further evaluation either here or in the emergency department. At the time of discharge, she verbalized understanding and consent with the discharge plan as it was reviewed with her. All questions were fielded by provider and/or clinic staff prior to patient discharge.    Final Clinical Impressions / Urgent Care Diagnoses:   Final diagnoses:  Urinary tract infection without hematuria, site unspecified    New Prescriptions:  [place]  Controlled Substance Registry consulted? Not Applicable  Meds ordered this encounter  Medications  . phenazopyridine (PYRIDIUM) 200 MG tablet    Sig: Take 1 tablet (200 mg total) by mouth 3 (three) times daily as needed for pain.    Dispense:  9 tablet    Refill:  0  . nitrofurantoin, macrocrystal-monohydrate, (MACROBID) 100 MG capsule    Sig: Take 1 capsule (100 mg total) by mouth 2 (two) times daily.    Dispense:  10 capsule    Refill:  0    Recommended Follow up Care:  Patient encouraged to follow up with the following provider within the specified time frame, or sooner as dictated by the severity of her symptoms. As always, she was instructed that for any urgent/emergent care needs, she should seek care either here or in the emergency department for more immediate evaluation.  Follow-up Information    [place] In 1 week.   Why:  General reassessment of symptoms if not improving Contact information: [address] [place] [address] [place] [address] [phone]         NOTE: This note was prepared using Dragon dictation software along with smaller phrase technology. Despite my best ability to proofread, there is the potential that transcriptional errors may still occur from this process, and are completely unintentional.    Karen Kitchens, NP 06/04/19 [phone]

## 2019-06-07 ENCOUNTER — Telehealth: Payer: Self-pay

## 2019-06-07 MED ORDER — CEPHALEXIN 500 MG PO CAPS: 500.0000 mg | ORAL_CAPSULE | Freq: Two times a day (BID) | ORAL | 0 refills | Status: DC

## 2019-06-07 NOTE — Telephone Encounter (Signed)
Patient called in stating that Macrobid is causing her to vomit. Spoke with Dr. Judd Gaudier and agreed to switch her to Keflex. I have sent to the pharmacy and patient is aware. No Urine Culture was ordered, so unable to determine sensitivity.

## 2019-06-22 NOTE — Progress Notes (Signed)
06/23/19 11:09 PM   [name] Dec 04, 2000 [id number]  Referring provider: Verlee Monte, NP [address] [place],  [place] [address] No chief complaint on file.   HPI: Claire Allen is a 19 y.o. F who presents today for the evaluation and management of rUTIs.   Visited ED on 06/02/19 c/o urinary symptoms that began with 4 days prior to visited. She complained of dysuria, frequency, and urgency. She has not appreciated any gross hematuria. Urine was suspicious for infection w/ no culture sent. Treated w/ Macrobid however discontinued due to nausea and switched to Keflex which she also stopped taking.   CT from 2015 revealed normal kidneys.   She reports of UTI symptoms for the past 3 years and associates sweating w/ UTIs. She only remembers enuresis or not being able to move during the night as a child.  She has been taking 2 Azo and probiotics per day.   She states of constipation for a long time which worsened when she was 19 years old dissapating thereafter. She usually goes 2 days w/o constipation. She has seen improvement after changing diet including discontinuation of fast food and taking fiber pills.    She is sexually active vaginally.  PVR 5 mL.   +Urine cultures  05/04/19 indicative of E. Coli  02/06/19 indicative of E. Coli resistant to ampicillin, ampicillin/sulbactam, and trimeth/sulfa  07/04/18 indicative of E. Coli resistant to ampicillin, ampicillin/sulbactam, and trimeth/sulfa  05/19/18 indicative of S. Saprophyticus resistant to oxacillin    PMH: Past Medical History:  Diagnosis Date  . Acid reflux   . Anxiety   . Depression     Surgical History: Past Surgical History:  Procedure Laterality Date  . NO PAST SURGERIES      Home Medications:  Allergies as of 06/23/2019   No Known Allergies     Medication List       Accurate as of June 22, 2019 11:09 PM. If you have any questions, ask your nurse or doctor.        buPROPion 300 MG 24 hr  tablet Commonly known as: WELLBUTRIN XL TAKE 1 TABLET (300 MG TOTAL) BY MOUTH NIGHTLY.   cephALEXin 500 MG capsule Commonly known as: KEFLEX Take 1 capsule (500 mg total) by mouth 2 (two) times daily.   hydrOXYzine 25 MG capsule Commonly known as: VISTARIL   nitrofurantoin (macrocrystal-monohydrate) 100 MG capsule Commonly known as: MACROBID Take 1 capsule (100 mg total) by mouth 2 (two) times daily.   Norethindrone Acetate-Ethinyl Estrad-FE 1-20 MG-MCG(24) tablet Commonly known as: LOESTRIN 24 FE Take 1 tablet by mouth daily.   phenazopyridine 200 MG tablet Commonly known as: PYRIDIUM Take 1 tablet (200 mg total) by mouth 3 (three) times daily as needed for pain.       Allergies: No Known Allergies  Family History: Family History  Problem Relation Age of Onset  . Heart attack Maternal Grandmother   . Hypertension Maternal Grandmother   . Hypertension Maternal Grandfather   . Endometriosis Mother   . Hypertension Father   . Hypertension Maternal Uncle   . Heart attack Maternal Uncle     Social History:  reports that she has never smoked. She has never used smokeless tobacco. She reports that she does not drink alcohol and does not use drugs.   Physical Exam: LMP 05/31/2019   Constitutional:  Alert and oriented, No acute distress. HEENT: [place] AT, moist mucus membranes.  Trachea midline, no masses. Cardiovascular: No clubbing, cyanosis, or  edema. Respiratory: Normal respiratory effort, no increased work of breathing. Skin: No rashes, bruises or suspicious lesions. Neurologic: Grossly intact, no focal deficits, moving all 4 extremities. Psychiatric: Normal mood and affect.  Laboratory Data:  Urinalysis Negative   Pertinent Imaging: Results for orders placed or performed in visit on 06/23/19  Bladder Scan (Post Void Residual) in office  Result Value Ref Range   Scan Result 5    Assessment & Plan:    1. rUTI  Encouraged to continue using Azo, probiotics,  and increased water intake  Recommended RUS to rule out kidney stones  Will prescribe abx to be taken after intercourse Advised pt about birth control and abx use  Reviewed safe sex practices  Return in 3 months w/ PA-C  2. Chronic constipation  Encouraged continuation of behavioral management   F/u 3 months to [facility] [address] [place], [place] [address] [phone]  I, Lucas Mallow, am acting as a scribe for Dr. Hollice Espy,  I have reviewed the above documentation for accuracy and completeness, and I agree with the above.   Hollice Espy, MD  I spent 45 total minutes on the day of the encounter including pre-visit review of the medical record, face-to-face time with the patient, and post visit ordering of labs/imaging/tests.

## 2019-06-23 ENCOUNTER — Encounter (INDEPENDENT_AMBULATORY_CARE_PROVIDER_SITE_OTHER): Payer: Self-pay

## 2019-06-23 ENCOUNTER — Encounter: Payer: Self-pay | Admitting: Urology

## 2019-06-23 ENCOUNTER — Other Ambulatory Visit: Payer: Self-pay

## 2019-06-23 ENCOUNTER — Ambulatory Visit (INDEPENDENT_AMBULATORY_CARE_PROVIDER_SITE_OTHER): Payer: BC Managed Care – PPO | Admitting: Urology

## 2019-06-23 VITALS — BP 112/76 | HR 103 | Ht 62.0 in | Wt 123.0 lb

## 2019-06-23 DIAGNOSIS — N390 Urinary tract infection, site not specified: Secondary | ICD-10-CM | POA: Diagnosis not present

## 2019-06-23 LAB — BLADDER SCAN AMB NON-IMAGING: Scan Result: 5

## 2019-06-23 MED ORDER — CEPHALEXIN 500 MG PO CAPS: 500.0000 mg | ORAL_CAPSULE | ORAL | 1 refills | Status: DC | PRN

## 2019-06-25 LAB — URINALYSIS, COMPLETE
Bilirubin, UA: NEGATIVE
Glucose, UA: NEGATIVE
Ketones, UA: NEGATIVE
Leukocytes,UA: NEGATIVE
Nitrite, UA: NEGATIVE
Protein,UA: NEGATIVE
RBC, UA: NEGATIVE
Specific Gravity, UA: 1.025 (ref 1.005–1.030)
Urobilinogen, Ur: 0.2 mg/dL (ref 0.2–1.0)
pH, UA: 5.5 (ref 5.0–7.5)

## 2019-06-25 LAB — MICROSCOPIC EXAMINATION

## 2019-07-28 ENCOUNTER — Ambulatory Visit
Admission: RE | Admit: 2019-07-28 | Discharge: 2019-07-28 | Disposition: A | Discharge status: Home / Self Care | Payer: BC Managed Care – PPO | Source: Ambulatory Visit | Attending: Urology | Admitting: Urology

## 2019-07-28 ENCOUNTER — Other Ambulatory Visit: Payer: Self-pay

## 2019-07-28 ENCOUNTER — Other Ambulatory Visit: Payer: Self-pay | Admitting: Urology

## 2019-07-28 DIAGNOSIS — N390 Urinary tract infection, site not specified: Secondary | ICD-10-CM | POA: Diagnosis not present

## 2019-07-29 ENCOUNTER — Telehealth: Payer: Self-pay | Admitting: *Deleted

## 2019-07-29 NOTE — Telephone Encounter (Addendum)
Unable to reach patient, cannot leave VM.  ----- Message from Vanna Scotland, MD sent at 07/29/2019  8:57 AM EDT ----- RUS is normal.    Vanna Scotland, MD

## 2019-09-30 ENCOUNTER — Ambulatory Visit: Payer: BC Managed Care – PPO | Admitting: Urology

## 2019-10-01 ENCOUNTER — Encounter: Payer: Self-pay | Admitting: Urology

## 2019-11-09 ENCOUNTER — Other Ambulatory Visit: Payer: Self-pay

## 2019-11-09 ENCOUNTER — Ambulatory Visit
Admission: EM | Admit: 2019-11-09 | Discharge: 2019-11-09 | Disposition: A | Discharge status: Home / Self Care | Payer: BC Managed Care – PPO | Attending: Emergency Medicine | Admitting: Emergency Medicine

## 2019-11-09 ENCOUNTER — Encounter: Payer: Self-pay | Admitting: Emergency Medicine

## 2019-11-09 DIAGNOSIS — M545 Low back pain, unspecified: Secondary | ICD-10-CM | POA: Diagnosis not present

## 2019-11-09 DIAGNOSIS — R109 Unspecified abdominal pain: Secondary | ICD-10-CM | POA: Diagnosis not present

## 2019-11-09 LAB — URINALYSIS, COMPLETE (UACMP) WITH MICROSCOPIC
Bilirubin Urine: NEGATIVE
Glucose, UA: NEGATIVE mg/dL
Hgb urine dipstick: NEGATIVE
Ketones, ur: NEGATIVE mg/dL
Leukocytes,Ua: NEGATIVE
Nitrite: NEGATIVE
Protein, ur: NEGATIVE mg/dL
Specific Gravity, Urine: 1.015 (ref 1.005–1.030)
pH: 6 (ref 5.0–8.0)

## 2019-11-09 LAB — PREGNANCY, URINE: Preg Test, Ur: NEGATIVE

## 2019-11-09 NOTE — ED Provider Notes (Signed)
[facility]    CSN: [id number] Arrival date & time: 11/09/19  1316      History   Chief Complaint Chief Complaint  Patient presents with  . Back Pain    HPI Claire Allen is a 19 y.o. female lower abdominal cramping and lower back pain x3 days.  Patient states that she is feeling a little bit better today.  She denies any associated fever, fatigue, chills, sweats, sore throat, cough, congestion, nausea, vomiting or diarrhea.  Denies any dysuria, urinary frequency urgency.  Denies any vaginal discharge.  The last menstrual period was about a month ago.  States she took a pregnancy test was negative yesterday.  Patient says she gets frequent UTIs and wants to make sure she does not be UTI today.  She does take oral contraceptives.  Denies any concern for STIs.  Denies any known Covid exposure and fully vaccinated for Covid.  Patient denies any other concerns today.  HPI  Past Medical History:  Diagnosis Date  . Acid reflux   . Anxiety   . Depression     Patient Active Problem List   Diagnosis Date Noted  . Dysmenorrhea 07/03/2016  . Anxiety and depression 06/27/2016  . Patient is Jehovah's Witness 11/11/2015  . Other constipation 11/04/2015  . Acid reflux 10/13/2015    Past Surgical History:  Procedure Laterality Date  . NO PAST SURGERIES      OB History    Gravida  0   Para  0   Term  0   Preterm  0   AB  0   Living  0     SAB  0   TAB  0   Ectopic  0   Multiple  0   Live Births  0            Home Medications    Prior to Admission medications   Medication Sig Start Date End Date Taking? Authorizing Provider  buPROPion (WELLBUTRIN XL) 300 MG 24 hr tablet TAKE 1 TABLET (300 MG TOTAL) BY MOUTH NIGHTLY. 05/01/16  Yes [provider]  Norethindrone Acetate-Ethinyl Estrad-FE (LOESTRIN 24 FE) 1-20 MG-MCG(24) tablet Take 1 tablet by mouth daily. 07/03/16  Yes Reva Bores, MD  cephALEXin (KEFLEX) 500 MG capsule Take 1 capsule  (500 mg total) by mouth as needed (after intercourse (sex)). 06/23/19   Vanna Scotland, MD  FLUoxetine (PROZAC) 20 MG capsule Take 20 mg by mouth daily. 10/14/19   [provider]  hydrOXYzine (VISTARIL) 25 MG capsule  05/21/18   [provider]    Family History Family History  Problem Relation Age of Onset  . Heart attack Maternal Grandmother   . Hypertension Maternal Grandmother   . Hypertension Maternal Grandfather   . Endometriosis Mother   . Hypertension Father   . Hypertension Maternal Uncle   . Heart attack Maternal Uncle     Social History Social History   Tobacco Use  . Smoking status: Never Smoker  . Smokeless tobacco: Never Used  Vaping Use  . Vaping Use: Never used  Substance Use Topics  . Alcohol use: No  . Drug use: No     Allergies   Patient has no known allergies.   Review of Systems Review of Systems  Constitutional: Negative for fatigue and fever.  HENT: Negative for congestion and sore throat.   Respiratory: Negative for cough and shortness of breath.   Cardiovascular: Negative for chest pain.  Gastrointestinal: Positive for abdominal pain.  Negative for diarrhea, nausea and vomiting.  Genitourinary: Negative for dysuria, frequency, pelvic pain and vaginal discharge.  Musculoskeletal: Positive for back pain.  Neurological: Negative for weakness.     Physical Exam Triage Vital Signs ED Triage Vitals  Enc Vitals Group     BP 11/09/19 1359 104/65     Pulse Rate 11/09/19 1359 89     Resp 11/09/19 1359 18     Temp 11/09/19 1359 98.2 F (36.8 C)     Temp Source 11/09/19 1359 Oral     SpO2 11/09/19 1359 100 %     Weight 11/09/19 1356 123 lb 0.3 oz (55.8 kg)     Height 11/09/19 1356 5\' 2"  (1.575 m)     Head Circumference --      Peak Flow --      Pain Score 11/09/19 1356 6     Pain Loc --      Pain Edu? --      Excl. in GC? --    No data found.  Updated Vital Signs BP 104/65 (BP Location: Left Arm)   Pulse 89   Temp  98.2 F (36.8 C) (Oral)   Resp 18   Ht 5\' 2"  (1.575 m)   Wt 123 lb 0.3 oz (55.8 kg)   LMP 10/09/2019 (Approximate)   SpO2 100%   BMI 22.50 kg/m       Physical Exam Vitals and nursing note reviewed.  Constitutional:      General: She is not in acute distress.    Appearance: Normal appearance. She is not ill-appearing or toxic-appearing.  HENT:     Head: Normocephalic and atraumatic.  Eyes:     General: No scleral icterus.       Right eye: No discharge.        Left eye: No discharge.     Conjunctiva/sclera: Conjunctivae normal.  Cardiovascular:     Rate and Rhythm: Normal rate and regular rhythm.     Heart sounds: Normal heart sounds.  Pulmonary:     Effort: Pulmonary effort is normal. No respiratory distress.     Breath sounds: Normal breath sounds.  Abdominal:     Palpations: Abdomen is soft.     Tenderness: There is no abdominal tenderness. There is no right CVA tenderness, left CVA tenderness or guarding.  Musculoskeletal:     Cervical back: Neck supple.  Skin:    General: Skin is dry.  Neurological:     General: No focal deficit present.     Mental Status: She is alert. Mental status is at baseline.     Motor: No weakness.     Gait: Gait normal.  Psychiatric:        Mood and Affect: Mood normal.        Behavior: Behavior normal.        Thought Content: Thought content normal.      UC Treatments / Results  Labs (all labs ordered are listed, but only abnormal results are displayed) Labs Reviewed  URINALYSIS, COMPLETE (UACMP) WITH MICROSCOPIC - Abnormal; Notable for the following components:      Result Value   Bacteria, UA MANY (*)    All other components within normal limits  URINE CULTURE  PREGNANCY, URINE    EKG   Radiology No results found.  Procedures Procedures (including critical care time)  Medications Ordered in UC Medications - No data to display  Initial Impression / Assessment and Plan / UC Course  I have reviewed the  triage  vital signs and the nursing notes.  Pertinent labs & imaging results that were available during my care of the patient were reviewed by me and considered in my medical decision making (see chart for details).   Urinalysis normal today.  Urine sent for culture.  Patient states she feels better today than yesterday and thinks she might have had a stomach bug.  Patient also believes she may be about to start her period which I do agree with.  Advised her to rest increase fluids at this time.  She did declines any other testing including STI work-up.  ED precautions for abdominal pain discussed. With Final Clinical Impressions(s) / UC Diagnoses   Final diagnoses:  Acute midline low back pain without sciatica  Abdominal cramping     Discharge Instructions     The urine test was normal today and your pregnancy is negative.  We will culture the urine and call you with results if you need to start antibiotic.  You may be about to start your period.  You can take Tylenol or Motrin for pain.  Increase rest and fluids.  Go to ER for any severe acute worsening of symptoms including fever, intractable vomiting or weakness.    ED Prescriptions    None     PDMP not reviewed this encounter.   Shirlee Latch, PA-C 11/09/19 1507

## 2019-11-09 NOTE — Discharge Instructions (Signed)
The urine test was normal today and your pregnancy is negative.  We will culture the urine and call you with results if you need to start antibiotic.  You may be about to start your period.  You can take Tylenol or Motrin for pain.  Increase rest and fluids.  Go to ER for any severe acute worsening of symptoms including fever, intractable vomiting or weakness.

## 2019-11-09 NOTE — ED Triage Notes (Signed)
Pt c/o lower back pain, lower abdominal cramping and subjective fever. Started about 3 days ago. She has a UTI about a month ago and does feel like it was fully treated.

## 2019-11-11 LAB — URINE CULTURE: Culture: 60000 — AB

## 2019-12-09 ENCOUNTER — Ambulatory Visit
Admission: EM | Admit: 2019-12-09 | Discharge: 2019-12-09 | Disposition: A | Discharge status: Home / Self Care | Payer: BC Managed Care – PPO | Attending: Family Medicine | Admitting: Family Medicine

## 2019-12-09 ENCOUNTER — Other Ambulatory Visit: Payer: Self-pay

## 2019-12-09 DIAGNOSIS — J069 Acute upper respiratory infection, unspecified: Secondary | ICD-10-CM | POA: Insufficient documentation

## 2019-12-09 DIAGNOSIS — Z20822 Contact with and (suspected) exposure to covid-19: Secondary | ICD-10-CM | POA: Insufficient documentation

## 2019-12-09 LAB — RESP PANEL BY RT-PCR (FLU A&B, COVID) ARPGX2
Influenza A by PCR: NEGATIVE
Influenza B by PCR: NEGATIVE
SARS Coronavirus 2 by RT PCR: NEGATIVE

## 2019-12-09 MED ORDER — BENZONATATE 200 MG PO CAPS: 200.0000 mg | ORAL_CAPSULE | Freq: Three times a day (TID) | ORAL | 0 refills | Status: DC | PRN

## 2019-12-09 MED ORDER — CETIRIZINE-PSEUDOEPHEDRINE ER 5-120 MG PO TB12: 1.0000 | ORAL_TABLET | Freq: Two times a day (BID) | ORAL | 0 refills | Status: DC

## 2019-12-09 MED ORDER — FLUTICASONE PROPIONATE 50 MCG/ACT NA SUSP: 2.0000 | Freq: Every day | NASAL | 0 refills | Status: DC

## 2019-12-09 NOTE — Discharge Instructions (Signed)
We will call with the results once they return.  Take care  Dr. Adriana Simas

## 2019-12-09 NOTE — ED Triage Notes (Signed)
Patient states that she has been having sinus pain and pressure, body aches, chills, ear pain x 5 days.

## 2019-12-10 NOTE — ED Provider Notes (Signed)
[facility]    CSN: [id number] Arrival date & time: 12/09/19  1230      History   Chief Complaint Chief Complaint  Patient presents with  . Cough   HPI  19 year old female presents with respiratory symptoms.  Patient states that she has been sick for the past 5 days.  Reports cough, congestion, sore throat, ear pain.  No documented fever.  No reported sick contacts.  No relieving factors.  She feels very poorly.  She is concerned that she may have a sinus infection or an ear infection.  Pain currently 5/10 in severity.  No other complaints.  Past Medical History:  Diagnosis Date  . Acid reflux   . Anxiety   . Depression     Patient Active Problem List   Diagnosis Date Noted  . Dysmenorrhea 07/03/2016  . Anxiety and depression 06/27/2016  . Patient is Jehovah's Witness 11/11/2015  . Other constipation 11/04/2015  . Acid reflux 10/13/2015    Past Surgical History:  Procedure Laterality Date  . NO PAST SURGERIES      OB History    Gravida  0   Para  0   Term  0   Preterm  0   AB  0   Living  0     SAB  0   IAB  0   Ectopic  0   Multiple  0   Live Births  0            Home Medications    Prior to Admission medications   Medication Sig Start Date End Date Taking? Authorizing Provider  buPROPion (WELLBUTRIN XL) 300 MG 24 hr tablet TAKE 1 TABLET (300 MG TOTAL) BY MOUTH NIGHTLY. 05/01/16  Yes [provider]  FLUoxetine (PROZAC) 20 MG capsule Take 20 mg by mouth daily. 10/14/19  Yes [provider]  Norethindrone Acetate-Ethinyl Estrad-FE (LOESTRIN 24 FE) 1-20 MG-MCG(24) tablet Take 1 tablet by mouth daily. 07/03/16  Yes Reva Bores, MD  benzonatate (TESSALON) 200 MG capsule Take 1 capsule (200 mg total) by mouth 3 (three) times daily as needed for cough. 12/09/19   Tommie Sams, DO  cetirizine-pseudoephedrine (ZYRTEC-D) 5-120 MG tablet Take 1 tablet by mouth 2 (two) times daily. 12/09/19   Tommie Sams, DO   fluticasone (FLONASE) 50 MCG/ACT nasal spray Place 2 sprays into both nostrils daily. 12/09/19   Tommie Sams, DO    Family History Family History  Problem Relation Age of Onset  . Heart attack Maternal Grandmother   . Hypertension Maternal Grandmother   . Hypertension Maternal Grandfather   . Endometriosis Mother   . Hypertension Father   . Hypertension Maternal Uncle   . Heart attack Maternal Uncle     Social History Social History   Tobacco Use  . Smoking status: Never Smoker  . Smokeless tobacco: Never Used  Vaping Use  . Vaping Use: Never used  Substance Use Topics  . Alcohol use: No  . Drug use: No     Allergies   Patient has no known allergies.   Review of Systems Review of Systems Per HPI  Physical Exam Triage Vital Signs ED Triage Vitals  Enc Vitals Group     BP 12/09/19 1358 124/83     Pulse Rate 12/09/19 1358 91     Resp 12/09/19 1358 18     Temp 12/09/19 1358 98.3 F (36.8 C)     Temp Source 12/09/19 1358 Oral  SpO2 12/09/19 1358 100 %     Weight 12/09/19 1356 125 lb (56.7 kg)     Height 12/09/19 1356 5\' 1"  (1.549 m)     Head Circumference --      Peak Flow --      Pain Score 12/09/19 1356 5     Pain Loc --      Pain Edu? --      Excl. in GC? --    Updated Vital Signs BP 124/83 (BP Location: Right Arm)   Pulse 91   Temp 98.3 F (36.8 C) (Oral)   Resp 18   Ht 5\' 1"  (1.549 m)   Wt 56.7 kg   LMP 12/05/2019   SpO2 100%   BMI 23.62 kg/m   Visual Acuity Right Eye Distance:   Left Eye Distance:   Bilateral Distance:    Right Eye Near:   Left Eye Near:    Bilateral Near:     Physical Exam Constitutional:      General: She is not in acute distress.    Appearance: Normal appearance. She is not ill-appearing.  HENT:     Head: Normocephalic and atraumatic.     Right Ear: Tympanic membrane normal.     Left Ear: Tympanic membrane normal.     Mouth/Throat:     Pharynx: Oropharynx is clear. No oropharyngeal exudate.  Eyes:      General:        Right eye: No discharge.        Left eye: No discharge.     Conjunctiva/sclera: Conjunctivae normal.  Cardiovascular:     Rate and Rhythm: Normal rate and regular rhythm.  Pulmonary:     Effort: Pulmonary effort is normal.     Breath sounds: Normal breath sounds. No wheezing or rales.  Neurological:     Mental Status: She is alert.    UC Treatments / Results  Labs (all labs ordered are listed, but only abnormal results are displayed) Labs Reviewed  RESP PANEL BY RT-PCR (FLU A&B, COVID) [id number]    EKG   Radiology No results found.  Procedures Procedures (including critical care time)  Medications Ordered in UC Medications - No data to display  Initial Impression / Assessment and Plan / UC Course  I have reviewed the triage vital signs and the nursing notes.  Pertinent labs & imaging results that were available during my care of the patient were reviewed by me and considered in my medical decision making (see chart for details).    19 year old female presents with a viral URI with cough. Flu and Covid testing negative today. Supportive care and symptomatic treatment with Flonase, Tessalon Perles, Zyrtec-D.  Final Clinical Impressions(s) / UC Diagnoses   Final diagnoses:  Viral URI with cough     Discharge Instructions     We will call with the results once they return.  Take care  Dr. 14/04/2019    ED Prescriptions    Medication Sig Dispense Auth. Provider   fluticasone (FLONASE) 50 MCG/ACT nasal spray Place 2 sprays into both nostrils daily. 16 g Nikolaus Pienta G, DO   benzonatate (TESSALON) 200 MG capsule Take 1 capsule (200 mg total) by mouth 3 (three) times daily as needed for cough. 30 capsule Umar Patmon G, DO   cetirizine-pseudoephedrine (ZYRTEC-D) 5-120 MG tablet Take 1 tablet by mouth 2 (two) times daily. 30 tablet 02-05-2003, DO     PDMP not reviewed this encounter.   [birth year]1983, DO  12/10/19 0813  

## 2019-12-15 ENCOUNTER — Encounter: Payer: Self-pay | Admitting: Emergency Medicine

## 2019-12-15 ENCOUNTER — Ambulatory Visit
Admission: EM | Admit: 2019-12-15 | Discharge: 2019-12-15 | Disposition: A | Discharge status: Home / Self Care | Payer: BC Managed Care – PPO | Attending: Emergency Medicine | Admitting: Emergency Medicine

## 2019-12-15 ENCOUNTER — Other Ambulatory Visit: Payer: Self-pay

## 2019-12-15 ENCOUNTER — Telehealth: Payer: Self-pay

## 2019-12-15 DIAGNOSIS — N390 Urinary tract infection, site not specified: Secondary | ICD-10-CM | POA: Diagnosis present

## 2019-12-15 LAB — URINALYSIS, COMPLETE (UACMP) WITH MICROSCOPIC
Bilirubin Urine: NEGATIVE
Glucose, UA: NEGATIVE mg/dL
Ketones, ur: NEGATIVE mg/dL
Nitrite: NEGATIVE
Protein, ur: NEGATIVE mg/dL
Specific Gravity, Urine: >1.03 — ABNORMAL HIGH (ref 1.005–1.030)
pH: 5.5 (ref 5.0–8.0)

## 2019-12-15 MED ORDER — CEPHALEXIN 500 MG PO CAPS: 500.0000 mg | ORAL_CAPSULE | ORAL | 1 refills | Status: AC | PRN

## 2019-12-15 MED ORDER — PHENAZOPYRIDINE HCL 200 MG PO TABS: 200.0000 mg | ORAL_TABLET | Freq: Three times a day (TID) | ORAL | 0 refills | Status: DC

## 2019-12-15 MED ORDER — CEPHALEXIN 500 MG PO CAPS: 500.0000 mg | ORAL_CAPSULE | Freq: Two times a day (BID) | ORAL | 0 refills | Status: DC

## 2019-12-15 NOTE — Discharge Instructions (Signed)
Take the Keflex twice daily for 5 days.  Use the Pyridium every 8 hours as needed for urinary discomfort.  This will make your pee bright orange.  We will send urine for culture and change the antibiotic if necessary.  Call [facility] urology at [phone] to request a follow-up appointment with Dr. Apolinar Junes if they will not give you a refill of your prophylactic antibiotic.

## 2019-12-15 NOTE — ED Triage Notes (Signed)
Patient c/o dysuria and urinary frequency that started 4 days ago.

## 2019-12-15 NOTE — ED Provider Notes (Signed)
[facility]    CSN: [id number] Arrival date & time: 12/15/19  1304      History   Chief Complaint Chief Complaint  Patient presents with  . Dysuria  . Urinary Frequency    HPI Claire Allen is a 19 y.o. female.   HPI   19 year old female here for evaluation of painful urination and urinary frequency.  Patient reports that she has been having symptoms for the past 4 days.  These are in conjunction with cloudiness to her urine and occasional low back pain.  Patient denies fever, blood in her urine, or abdominal pain.  Patient has a history of recurrent UTIs and has been evaluated by [facility] urology.  Patient reports that she was put on postcoital prophylaxis but she ran out.  Past Medical History:  Diagnosis Date  . Acid reflux   . Anxiety   . Depression     Patient Active Problem List   Diagnosis Date Noted  . Dysmenorrhea 07/03/2016  . Anxiety and depression 06/27/2016  . Patient is Jehovah's Witness 11/11/2015  . Other constipation 11/04/2015  . Acid reflux 10/13/2015    Past Surgical History:  Procedure Laterality Date  . NO PAST SURGERIES      OB History    Gravida  0   Para  0   Term  0   Preterm  0   AB  0   Living  0     SAB  0   IAB  0   Ectopic  0   Multiple  0   Live Births  0            Home Medications    Prior to Admission medications   Medication Sig Start Date End Date Taking? Authorizing Provider  buPROPion (WELLBUTRIN XL) 300 MG 24 hr tablet TAKE 1 TABLET (300 MG TOTAL) BY MOUTH NIGHTLY. 05/01/16  Yes [provider]  FLUoxetine (PROZAC) 20 MG capsule Take 20 mg by mouth daily. 10/14/19  Yes [provider]  Norethindrone Acetate-Ethinyl Estrad-FE (LOESTRIN 24 FE) 1-20 MG-MCG(24) tablet Take 1 tablet by mouth daily. 07/03/16  Yes Reva Bores, MD  cephALEXin (KEFLEX) 500 MG capsule Take 1 capsule (500 mg total) by mouth 2 (two) times daily for 5 days. 12/15/19 12/20/19  Becky Augusta, NP   phenazopyridine (PYRIDIUM) 200 MG tablet Take 1 tablet (200 mg total) by mouth 3 (three) times daily. 12/15/19   Becky Augusta, NP  fluticasone (FLONASE) 50 MCG/ACT nasal spray Place 2 sprays into both nostrils daily. 12/09/19 12/15/19  Tommie Sams, DO    Family History Family History  Problem Relation Age of Onset  . Heart attack Maternal Grandmother   . Hypertension Maternal Grandmother   . Hypertension Maternal Grandfather   . Endometriosis Mother   . Hypertension Father   . Hypertension Maternal Uncle   . Heart attack Maternal Uncle     Social History Social History   Tobacco Use  . Smoking status: Never Smoker  . Smokeless tobacco: Never Used  Vaping Use  . Vaping Use: Never used  Substance Use Topics  . Alcohol use: No  . Drug use: No     Allergies   Patient has no known allergies.   Review of Systems Review of Systems  Constitutional: Negative for activity change, appetite change and fever.  Genitourinary: Positive for dysuria, frequency and urgency. Negative for hematuria.  Musculoskeletal: Positive for back pain.  Skin: Negative for rash.  Hematological: Negative.  Psychiatric/Behavioral: Negative.      Physical Exam Triage Vital Signs ED Triage Vitals  Enc Vitals Group     BP 12/15/19 1404 115/79     Pulse Rate 12/15/19 1404 86     Resp 12/15/19 1404 18     Temp 12/15/19 1404 98.9 F (37.2 C)     Temp Source 12/15/19 1404 Oral     SpO2 12/15/19 1404 100 %     Weight 12/15/19 1403 125 lb (56.7 kg)     Height 12/15/19 1403 5\' 1"  (1.549 m)     Head Circumference --      Peak Flow --      Pain Score 12/15/19 1403 0     Pain Loc --      Pain Edu? --      Excl. in GC? --    No data found.  Updated Vital Signs BP 115/79 (BP Location: Right Arm)   Pulse 86   Temp 98.9 F (37.2 C) (Oral)   Resp 18   Ht 5\' 1"  (1.549 m)   Wt 125 lb (56.7 kg)   LMP 12/05/2019   SpO2 100%   BMI 23.62 kg/m   Visual Acuity Right Eye Distance:   Left Eye  Distance:   Bilateral Distance:    Right Eye Near:   Left Eye Near:    Bilateral Near:     Physical Exam Vitals and nursing note reviewed.  Constitutional:      General: She is not in acute distress.    Appearance: Normal appearance. She is normal weight. She is not toxic-appearing.  HENT:     Head: Normocephalic and atraumatic.  Cardiovascular:     Rate and Rhythm: Normal rate and regular rhythm.     Pulses: Normal pulses.     Heart sounds: Normal heart sounds. No murmur heard. No gallop.   Pulmonary:     Effort: Pulmonary effort is normal.     Breath sounds: Normal breath sounds. No wheezing, rhonchi or rales.  Abdominal:     Tenderness: There is no right CVA tenderness or left CVA tenderness.  Musculoskeletal:        General: No swelling or tenderness. Normal range of motion.  Skin:    General: Skin is warm and dry.     Capillary Refill: Capillary refill takes less than 2 seconds.     Findings: No erythema or rash.  Neurological:     General: No focal deficit present.     Mental Status: She is alert and oriented to person, place, and time.  Psychiatric:        Mood and Affect: Mood normal.        Behavior: Behavior normal.        Thought Content: Thought content normal.        Judgment: Judgment normal.      UC Treatments / Results  Labs (all labs ordered are listed, but only abnormal results are displayed) Labs Reviewed  URINALYSIS, COMPLETE (UACMP) WITH MICROSCOPIC - Abnormal; Notable for the following components:      Result Value   Specific Gravity, Urine >1.030 (*)    Hgb urine dipstick SMALL (*)    Leukocytes,Ua TRACE (*)    Non Squamous Epithelial PRESENT (*)    Bacteria, UA MANY (*)    All other components within normal limits  URINE CULTURE    EKG   Radiology No results found.  Procedures Procedures (including critical care time)  Medications Ordered in  UC Medications - No data to display  Initial Impression / Assessment and Plan / UC  Course  I have reviewed the triage vital signs and the nursing notes.  Pertinent labs & imaging results that were available during my care of the patient were reviewed by me and considered in my medical decision making (see chart for details).   Duration of UTI symptoms that began on the last 4 days.  Patient has a history of recurrent UTI symptoms.  Patient is nontoxic in appearance.  Patient has no CVA tenderness.  Belly is benign.  Collected in triage is positive for blood, trace leukocytes, 11-20 WBCs, many bacteria.  Will send urine for culture.  Will treat with Keflex 500 mg twice daily and Pyridium.   Final Clinical Impressions(s) / UC Diagnoses   Final diagnoses:  Lower urinary tract infectious disease     Discharge Instructions     Take the Keflex twice daily for 5 days.  Use the Pyridium every 8 hours as needed for urinary discomfort.  This will make your pee bright orange.  We will send urine for culture and change the antibiotic if necessary.  Call [facility] urology at [phone] to request a follow-up appointment with Dr. Apolinar Junes if they will not give you a refill of your prophylactic antibiotic.    ED Prescriptions    Medication Sig Dispense Auth. Provider   cephALEXin (KEFLEX) 500 MG capsule Take 1 capsule (500 mg total) by mouth 2 (two) times daily for 5 days. 10 capsule Becky Augusta, NP   phenazopyridine (PYRIDIUM) 200 MG tablet Take 1 tablet (200 mg total) by mouth 3 (three) times daily. 6 tablet Becky Augusta, NP     PDMP not reviewed this encounter.   Becky Augusta, NP 12/15/19 1513

## 2019-12-15 NOTE — Telephone Encounter (Signed)
Spoke with Dr. Apolinar Junes, pt does not need a follow up, was seen 6 months ago. Medication refilled. Patient aware and verbalized understanding.

## 2019-12-16 ENCOUNTER — Ambulatory Visit: Payer: Self-pay | Admitting: Urology

## 2019-12-17 LAB — URINE CULTURE
Culture: >=100000 — AB
Special Requests: NORMAL

## 2019-12-31 ENCOUNTER — Other Ambulatory Visit: Payer: Self-pay | Admitting: Family Medicine

## 2020-03-05 ENCOUNTER — Encounter: Payer: Self-pay | Admitting: Emergency Medicine

## 2020-03-05 ENCOUNTER — Other Ambulatory Visit: Payer: Self-pay

## 2020-03-05 ENCOUNTER — Ambulatory Visit
Admission: EM | Admit: 2020-03-05 | Discharge: 2020-03-05 | Disposition: A | Discharge status: Home / Self Care | Payer: 59 | Attending: Physician Assistant | Admitting: Physician Assistant

## 2020-03-05 DIAGNOSIS — B351 Tinea unguium: Secondary | ICD-10-CM

## 2020-03-05 DIAGNOSIS — L03031 Cellulitis of right toe: Secondary | ICD-10-CM

## 2020-03-05 MED ORDER — MUPIROCIN 2 % EX OINT: 1.0000 "application " | TOPICAL_OINTMENT | Freq: Three times a day (TID) | CUTANEOUS | 0 refills | Status: AC

## 2020-03-05 MED ORDER — DOXYCYCLINE HYCLATE 100 MG PO CAPS: 100.0000 mg | ORAL_CAPSULE | Freq: Two times a day (BID) | ORAL | 0 refills | Status: AC

## 2020-03-05 NOTE — ED Triage Notes (Signed)
Pt c/o right great toe pain, swollen and red. Started about 5 days ago. Denies injury.

## 2020-03-05 NOTE — Discharge Instructions (Signed)
Your toe is infected. Complete course of antibiotics. Warm soaks a couple of times per day and apply the ointment to cuticle area.   Follow up with PCP about fungal toenail. As discussed these are difficult to treat.   Follow up with [place] or PCP if toe infection not improving over the next few days or if condition worsens.

## 2020-03-05 NOTE — ED Provider Notes (Signed)
[facility]    CSN: [id number] Arrival date & time: 03/05/20  0093      History   Chief Complaint Chief Complaint  Patient presents with  . Toe Pain    Right great toe    HPI Claire Allen is a 20 y.o. female presenting for redness and swelling around the cuticle area of the right great toe.  She says that this has been ongoing for about 5 days but worse over the past couple days.  Admits to pain in this general area.  She has not noticed any drainage of blood or pus.  Patient states that she had an ingrown toenail and cut the edges herself.  She states that after that she had the redness and swelling.  She has been cleaning with baking soda and soaking in warm water.  She denies any history of recurrent skin infections or toenail problem.  No fevers.  No pain on ambulating.  No other concerns.  HPI  Past Medical History:  Diagnosis Date  . Acid reflux   . Anxiety   . Depression     Patient Active Problem List   Diagnosis Date Noted  . Dysmenorrhea 07/03/2016  . Anxiety and depression 06/27/2016  . Patient is Jehovah's Witness 11/11/2015  . Other constipation 11/04/2015  . Acid reflux 10/13/2015    Past Surgical History:  Procedure Laterality Date  . NO PAST SURGERIES      OB History    Gravida  0   Para  0   Term  0   Preterm  0   AB  0   Living  0     SAB  0   IAB  0   Ectopic  0   Multiple  0   Live Births  0            Home Medications    Prior to Admission medications   Medication Sig Start Date End Date Taking? Authorizing Provider  buPROPion (WELLBUTRIN XL) 300 MG 24 hr tablet TAKE 1 TABLET (300 MG TOTAL) BY MOUTH NIGHTLY. 05/01/16  Yes [provider]  doxycycline (VIBRAMYCIN) 100 MG capsule Take 1 capsule (100 mg total) by mouth 2 (two) times daily for 7 days. 03/05/20 03/12/20 Yes Shirlee Latch, PA-C  FLUoxetine (PROZAC) 20 MG capsule Take 20 mg by mouth daily. 10/14/19  Yes [provider]  mupirocin  ointment (BACTROBAN) 2 % Apply 1 application topically 3 (three) times daily for 7 days. 03/05/20 03/12/20 Yes Shirlee Latch, PA-C  Norethindrone Acetate-Ethinyl Estrad-FE (LOESTRIN 24 FE) 1-20 MG-MCG(24) tablet Take 1 tablet by mouth daily. 07/03/16  Yes Reva Bores, MD  phenazopyridine (PYRIDIUM) 200 MG tablet Take 1 tablet (200 mg total) by mouth 3 (three) times daily. 12/15/19   Becky Augusta, NP  fluticasone (FLONASE) 50 MCG/ACT nasal spray Place 2 sprays into both nostrils daily. 12/09/19 12/15/19  Tommie Sams, DO    Family History Family History  Problem Relation Age of Onset  . Heart attack Maternal Grandmother   . Hypertension Maternal Grandmother   . Hypertension Maternal Grandfather   . Endometriosis Mother   . Hypertension Father   . Hypertension Maternal Uncle   . Heart attack Maternal Uncle     Social History Social History   Tobacco Use  . Smoking status: Never Smoker  . Smokeless tobacco: Never Used  Vaping Use  . Vaping Use: Never used  Substance Use Topics  . Alcohol use: Yes  .  Drug use: No     Allergies   Patient has no known allergies.   Review of Systems Review of Systems  Constitutional: Negative for fatigue and fever.  Musculoskeletal: Negative for arthralgias, gait problem and joint swelling.  Skin: Positive for color change. Negative for wound.  Neurological: Negative for weakness and numbness.     Physical Exam Triage Vital Signs ED Triage Vitals  Enc Vitals Group     BP 03/05/20 0832 114/78     Pulse Rate 03/05/20 0832 88     Resp 03/05/20 0832 18     Temp 03/05/20 0832 98.5 F (36.9 C)     Temp Source 03/05/20 0832 Oral     SpO2 03/05/20 0832 100 %     Weight 03/05/20 0831 125 lb (56.7 kg)     Height 03/05/20 0831 5\' 1"  (1.549 m)     Head Circumference --      Peak Flow --      Pain Score 03/05/20 0831 8     Pain Loc --      Pain Edu? --      Excl. in GC? --    No data found.  Updated Vital Signs BP 114/78 (BP Location:  Left Arm)   Pulse 88   Temp 98.5 F (36.9 C) (Oral)   Resp 18   Ht 5\' 1"  (1.549 m)   Wt 125 lb (56.7 kg)   LMP 03/03/2020 (Approximate)   SpO2 100%   BMI 23.62 kg/m       Physical Exam Vitals and nursing note reviewed.  Constitutional:      General: She is not in acute distress.    Appearance: Normal appearance. She is not ill-appearing or toxic-appearing.  HENT:     Head: Normocephalic and atraumatic.  Eyes:     General: No scleral icterus.       Right eye: No discharge.        Left eye: No discharge.     Conjunctiva/sclera: Conjunctivae normal.  Cardiovascular:     Rate and Rhythm: Normal rate and regular rhythm.     Pulses: Normal pulses.  Pulmonary:     Effort: Pulmonary effort is normal. No respiratory distress.  Musculoskeletal:     Cervical back: Neck supple.  Skin:    General: Skin is dry.     Comments: Right great toe: Onychomycosis of great toenail.  There has been want removal on both sides of the toenail done.  No ingrown toenail.  Tenderness moderate swelling, redness and warmth of the cuticle region extending to the medial aspect of the toe.  Full ROM of the toe.  Neurological:     General: No focal deficit present.     Mental Status: She is alert. Mental status is at baseline.     Motor: No weakness.     Gait: Gait normal.  Psychiatric:        Mood and Affect: Mood normal.        Behavior: Behavior normal.        Thought Content: Thought content normal.      UC Treatments / Results  Labs (all labs ordered are listed, but only abnormal results are displayed) Labs Reviewed - No data to display  EKG   Radiology No results found.  Procedures Procedures (including critical care time)  Medications Ordered in UC Medications - No data to display  Initial Impression / Assessment and Plan / UC Course  I have reviewed the triage vital  signs and the nursing notes.  Pertinent labs & imaging results that were available during my care of the  patient were reviewed by me and considered in my medical decision making (see chart for details).   Exam consistent with onychomycosis of the right great toenail.  She has removed portions of the nail that were ingrown and I no longer see an ingrown toenail.  There does appear to be secondary bacterial infection however.  Treating at this time with doxycycline and mupirocin.  Advised warm soaks at home.  Advised her to follow-up with PCP or podiatry regarding the fungal infection of the toenail.  Advised to follow-up with our department sooner if the infection worsens or if it is not improving with this treatment.   Final Clinical Impressions(s) / UC Diagnoses   Final diagnoses:  Cellulitis of great toe of right foot  Onychomycosis of right great toe     Discharge Instructions     Your toe is infected. Complete course of antibiotics. Warm soaks a couple of times per day and apply the ointment to cuticle area.   Follow up with PCP about fungal toenail. As discussed these are difficult to treat.   Follow up with [place] or PCP if toe infection not improving over the next few days or if condition worsens.     ED Prescriptions    Medication Sig Dispense Auth. Provider   doxycycline (VIBRAMYCIN) 100 MG capsule Take 1 capsule (100 mg total) by mouth 2 (two) times daily for 7 days. 14 capsule Eusebio Friendly B, PA-C   mupirocin ointment (BACTROBAN) 2 % Apply 1 application topically 3 (three) times daily for 7 days. 22 g Shirlee Latch, PA-C     PDMP not reviewed this encounter.   Shirlee Latch, PA-C 03/05/20 [phone]

## 2020-03-24 ENCOUNTER — Ambulatory Visit
Admission: EM | Admit: 2020-03-24 | Discharge: 2020-03-24 | Disposition: A | Discharge status: Home / Self Care | Payer: 59 | Attending: Sports Medicine | Admitting: Sports Medicine

## 2020-03-24 ENCOUNTER — Other Ambulatory Visit: Payer: Self-pay

## 2020-03-24 ENCOUNTER — Encounter: Payer: Self-pay | Admitting: Emergency Medicine

## 2020-03-24 DIAGNOSIS — L03031 Cellulitis of right toe: Secondary | ICD-10-CM | POA: Diagnosis not present

## 2020-03-24 DIAGNOSIS — B351 Tinea unguium: Secondary | ICD-10-CM

## 2020-03-24 DIAGNOSIS — M79674 Pain in right toe(s): Secondary | ICD-10-CM

## 2020-03-24 MED ORDER — DOXYCYCLINE HYCLATE 100 MG PO CAPS: 100.0000 mg | ORAL_CAPSULE | Freq: Two times a day (BID) | ORAL | 0 refills | Status: DC

## 2020-03-24 NOTE — ED Triage Notes (Signed)
Pt states she was seen on 03/05/20 and treated with antibiotic for a ingrown toe nail. She states she took the antibiotic wrong (once a daily instead of twice) and states it got better but the swelling pain came back about 1 day ago.

## 2020-03-24 NOTE — Discharge Instructions (Signed)
Your exam is consistent with some cellulitis around the right great toe.  You also have onychomycosis. I have given you antibiotics.  You have topical Bactroban that you can continue use. I am also going to give you the name of podiatry to see and discuss potential options for treating your nail fungus which is difficult to treat. Please take the antibiotics as scheduled. If your symptoms worsen please go to your primary care provider.  Otherwise follow-up with podiatry.  I hope you get the feeling better, Dr. Zachery Dauer

## 2020-03-24 NOTE — ED Provider Notes (Signed)
[facility]    CSN: [id number] Arrival date & time: 03/24/20  3267      History   Chief Complaint Chief Complaint  Patient presents with  . Toe Pain    Right great toe    HPI Claire Allen is a 20 y.o. female.   Patient pleasant 20 year old female who presents for evaluation of the above issue.  She was seen here March 05, 2020.  Please see that note for full details.  Diagnosed with cellulitis and onychomycosis.  Was given doxycycline and Bactroban.  Unfortunately she only took the doxycycline once a day.  It actually did resolve her symptoms even though she was not taking it as prescribed.  Unfortunately her symptoms did come back.  She comes back in today for reevaluation.  She denies any chest pain or shortness of breath.  No fever shakes chills.  No nausea vomiting diarrhea.  No abdominal or urinary symptoms.  No red flag signs or symptoms elicited on history.  She also reports that she has had some sickness and that toenail for quite a while now and has difficulty clipping her toenails.  She thinks that led to the infection.  She would like it taken care of definitively.     Past Medical History:  Diagnosis Date  . Acid reflux   . Anxiety   . Depression     Patient Active Problem List   Diagnosis Date Noted  . Dysmenorrhea 07/03/2016  . Anxiety and depression 06/27/2016  . Patient is Jehovah's Witness 11/11/2015  . Other constipation 11/04/2015  . Acid reflux 10/13/2015    Past Surgical History:  Procedure Laterality Date  . NO PAST SURGERIES      OB History    Gravida  0   Para  0   Term  0   Preterm  0   AB  0   Living  0     SAB  0   IAB  0   Ectopic  0   Multiple  0   Live Births  0            Home Medications    Prior to Admission medications   Medication Sig Start Date End Date Taking? Authorizing Provider  buPROPion (WELLBUTRIN XL) 300 MG 24 hr tablet TAKE 1 TABLET (300 MG TOTAL) BY MOUTH NIGHTLY. 05/01/16  Yes  [provider]  doxycycline (VIBRAMYCIN) 100 MG capsule Take 1 capsule (100 mg total) by mouth 2 (two) times daily. 03/24/20  Yes Delton See, MD  FLUoxetine (PROZAC) 20 MG capsule Take 20 mg by mouth daily. 10/14/19  Yes [provider]  Norethindrone Acetate-Ethinyl Estrad-FE (LOESTRIN 24 FE) 1-20 MG-MCG(24) tablet Take 1 tablet by mouth daily. 07/03/16  Yes Reva Bores, MD  phenazopyridine (PYRIDIUM) 200 MG tablet Take 1 tablet (200 mg total) by mouth 3 (three) times daily. 12/15/19   Becky Augusta, NP  fluticasone (FLONASE) 50 MCG/ACT nasal spray Place 2 sprays into both nostrils daily. 12/09/19 12/15/19  Tommie Sams, DO    Family History Family History  Problem Relation Age of Onset  . Heart attack Maternal Grandmother   . Hypertension Maternal Grandmother   . Hypertension Maternal Grandfather   . Endometriosis Mother   . Hypertension Father   . Hypertension Maternal Uncle   . Heart attack Maternal Uncle     Social History Social History   Tobacco Use  . Smoking status: Never Smoker  . Smokeless tobacco: Never Used  Vaping Use  . Vaping Use: Never used  Substance Use Topics  . Alcohol use: Yes  . Drug use: No     Allergies   Patient has no known allergies.   Review of Systems Review of Systems  Constitutional: Negative for activity change, appetite change, chills, diaphoresis, fatigue and fever.  HENT: Negative.   Eyes: Negative.   Respiratory: Negative.   Cardiovascular: Negative.   Gastrointestinal: Negative.   Genitourinary: Negative.   Musculoskeletal: Positive for arthralgias and joint swelling. Negative for back pain, gait problem, myalgias, neck pain and neck stiffness.  Skin: Positive for color change and wound.  Neurological: Negative.   All other systems reviewed and are negative.    Physical Exam Triage Vital Signs ED Triage Vitals  Enc Vitals Group     BP 03/24/20 0900 112/73     Pulse Rate 03/24/20 0900 91     Resp  03/24/20 0900 18     Temp 03/24/20 0900 98.5 F (36.9 C)     Temp Source 03/24/20 0900 Oral     SpO2 03/24/20 0900 100 %     Weight 03/24/20 0858 125 lb (56.7 kg)     Height 03/24/20 0858 5\' 1"  (1.549 m)     Head Circumference --      Peak Flow --      Pain Score 03/24/20 0858 4     Pain Loc --      Pain Edu? --      Excl. in GC? --    No data found.  Updated Vital Signs BP 112/73 (BP Location: Left Arm)   Pulse 91   Temp 98.5 F (36.9 C) (Oral)   Resp 18   Ht 5\' 1"  (1.549 m)   Wt 56.7 kg   LMP 03/03/2020 (Approximate)   SpO2 100%   BMI 23.62 kg/m   Visual Acuity Right Eye Distance:   Left Eye Distance:   Bilateral Distance:    Right Eye Near:   Left Eye Near:    Bilateral Near:     Physical Exam Vitals and nursing note reviewed.  Constitutional:      General: She is not in acute distress.    Appearance: Normal appearance. She is not ill-appearing, toxic-appearing or diaphoretic.  HENT:     Head: Normocephalic and atraumatic.     Nose: Nose normal.  Eyes:     Extraocular Movements: Extraocular movements intact.     Conjunctiva/sclera: Conjunctivae normal.     Pupils: Pupils are equal, round, and reactive to light.  Cardiovascular:     Rate and Rhythm: Normal rate.     Pulses: Normal pulses.     Heart sounds: Normal heart sounds.  Pulmonary:     Effort: Pulmonary effort is normal.     Breath sounds: Normal breath sounds.  Musculoskeletal:     Cervical back: Normal range of motion and neck supple.  Skin:    General: Skin is warm.     Capillary Refill: Capillary refill takes less than 2 seconds.     Findings: Erythema present.     Comments: Left foot and ankle: Normal to inspection palpation range of motion special test.  Right foot and ankle: Patient does have some redness and swelling around the cuticle of the great toe.  There are some minimal tenderness palpation.  There is no drainage or pus.  There is no evidence of phlebitis.  She has good range of  motion with no evidence of any tendon retraction.  Strength is well-preserved.  There is evidence of onychomycosis in that nail which is not present on any of the other nails in both feet.  Neurovascular: Normal sensation 2+ pulses distally.  Neurological:     General: No focal deficit present.     Mental Status: She is alert and oriented to person, place, and time.      UC Treatments / Results  Labs (all labs ordered are listed, but only abnormal results are displayed) Labs Reviewed - No data to display  EKG   Radiology No results found.  Procedures Procedures (including critical care time)  Medications Ordered in UC Medications - No data to display  Initial Impression / Assessment and Plan / UC Course  I have reviewed the triage vital signs and the nursing notes.  Pertinent labs & imaging results that were available during my care of the patient were reviewed by me and considered in my medical decision making (see chart for details).  Clinical impression: 1.  Cellulitis of the right great toe. 2.  Onychomycosis of the right great toe.  Treatment plan: 1.  The findings and treatment plan were discussed in detail with the patient.  Patient was in agreement. 2.  We will go ahead and treat her with doxycycline twice daily for 10 days. 3.  Continue with the Bactroban. 4.  Encouraged her to do warm soaks to try to push some of that infection to the surface and allowed to drain appropriately. 5.  Gave her the number for [facility] clinic podiatry she will call in her own for them.  She will probably need treatment for her onychomycosis.  She was open to going in discussing that. 6.  Educational handouts were provided. 7.  If she has difficulty getting in the podiatry of encouraged her to get in with her primary care provider to discuss treatment options for the onychomycosis. 8.  Follow-up here as needed.    Final Clinical Impressions(s) / UC Diagnoses   Final diagnoses:   Cellulitis of great toe of right foot  Onychomycosis of right great toe  Great toe pain, right     Discharge Instructions     Your exam is consistent with some cellulitis around the right great toe.  You also have onychomycosis. I have given you antibiotics.  You have topical Bactroban that you can continue use. I am also going to give you the name of podiatry to see and discuss potential options for treating your nail fungus which is difficult to treat. Please take the antibiotics as scheduled. If your symptoms worsen please go to your primary care provider.  Otherwise follow-up with podiatry.  I hope you get the feeling better, Dr. Zachery Dauer    ED Prescriptions    Medication Sig Dispense Auth. Provider   doxycycline (VIBRAMYCIN) 100 MG capsule Take 1 capsule (100 mg total) by mouth 2 (two) times daily. 20 capsule Delton See, MD     PDMP not reviewed this encounter.   Delton See, MD 03/24/20 [phone]

## 2020-10-05 ENCOUNTER — Other Ambulatory Visit: Payer: Self-pay

## 2020-10-05 ENCOUNTER — Ambulatory Visit
Admission: EM | Admit: 2020-10-05 | Discharge: 2020-10-05 | Disposition: A | Discharge status: Home / Self Care | Payer: 59 | Attending: Family Medicine | Admitting: Family Medicine

## 2020-10-05 DIAGNOSIS — N3001 Acute cystitis with hematuria: Secondary | ICD-10-CM | POA: Insufficient documentation

## 2020-10-05 LAB — POCT URINALYSIS DIP (DEVICE)
Glucose, UA: NEGATIVE mg/dL
Nitrite: NEGATIVE
Protein, ur: 100 mg/dL — AB
Specific Gravity, Urine: >=1.03 (ref 1.005–1.030)
Urobilinogen, UA: 0.2 mg/dL (ref 0.0–1.0)
pH: 5.5 (ref 5.0–8.0)

## 2020-10-05 MED ORDER — CEPHALEXIN 500 MG PO CAPS: 500.0000 mg | ORAL_CAPSULE | Freq: Two times a day (BID) | ORAL | 0 refills | Status: DC

## 2020-10-05 NOTE — ED Provider Notes (Signed)
[facility]    CSN: [id number] Arrival date & time: 10/05/20  1913      History   Chief Complaint Chief Complaint  Patient presents with   Dysuria    HPI 20 year old female with a history of frequent UTI presents with urinary symptoms.  Patient reports that she has had symptoms for the past 4 days.  She states that she has had urinary urgency, hematuria, cramping, and dysuria.  No associated nausea.  No vomiting.  She has some back pain.  No flank pain.  No documented fever.  No relieving factors.  She believes that she has UTI.  Past Medical History:  Diagnosis Date   Acid reflux    Anxiety    Depression     Patient Active Problem List   Diagnosis Date Noted   Dysmenorrhea 07/03/2016   Anxiety and depression 06/27/2016   Patient is Jehovah's Witness 11/11/2015   Other constipation 11/04/2015   Acid reflux 10/13/2015    Past Surgical History:  Procedure Laterality Date   NO PAST SURGERIES      OB History     Gravida  0   Para  0   Term  0   Preterm  0   AB  0   Living  0      SAB  0   IAB  0   Ectopic  0   Multiple  0   Live Births  0            Home Medications    Prior to Admission medications   Medication Sig Start Date End Date Taking? Authorizing Provider  cephALEXin (KEFLEX) 500 MG capsule Take 1 capsule (500 mg total) by mouth 2 (two) times daily. 10/05/20  Yes Ani Deoliveira G, DO  Norethindrone Acetate-Ethinyl Estrad-FE (LOESTRIN 24 FE) 1-20 MG-MCG(24) tablet Take 1 tablet by mouth daily. 07/03/16  Yes Reva Bores, MD  fluticasone (FLONASE) 50 MCG/ACT nasal spray Place 2 sprays into both nostrils daily. 12/09/19 12/15/19  Tommie Sams, DO    Family History Family History  Problem Relation Age of Onset   Heart attack Maternal Grandmother    Hypertension Maternal Grandmother    Hypertension Maternal Grandfather    Endometriosis Mother    Hypertension Father    Hypertension Maternal Uncle    Heart attack  Maternal Uncle     Social History Social History   Tobacco Use   Smoking status: Never   Smokeless tobacco: Never  Vaping Use   Vaping Use: Never used  Substance Use Topics   Alcohol use: Yes   Drug use: No     Allergies   Patient has no known allergies.   Review of Systems Review of Systems Per HPI  Physical Exam Triage Vital Signs ED Triage Vitals  Enc Vitals Group     BP 10/05/20 1938 100/64     Pulse Rate 10/05/20 1938 91     Resp 10/05/20 1938 18     Temp 10/05/20 1938 98.3 F (36.8 C)     Temp Source 10/05/20 1938 Oral     SpO2 10/05/20 1938 100 %     Weight 10/05/20 1937 113 lb (51.3 kg)     Height 10/05/20 1937 5\' 2"  (1.575 m)     Head Circumference --      Peak Flow --      Pain Score 10/05/20 1935 6     Pain Loc --      Pain Edu? --  Excl. in GC? --    Updated Vital Signs BP 100/64 (BP Location: Left Arm)   Pulse 91   Temp 98.3 F (36.8 C) (Oral)   Resp 18   Ht 5\' 2"  (1.575 m)   Wt 51.3 kg   SpO2 100%   BMI 20.67 kg/m   Visual Acuity Right Eye Distance:   Left Eye Distance:   Bilateral Distance:    Right Eye Near:   Left Eye Near:    Bilateral Near:     Physical Exam Vitals and nursing note reviewed.  Constitutional:      General: She is not in acute distress.    Appearance: Normal appearance. She is not ill-appearing.  HENT:     Head: Normocephalic and atraumatic.  Cardiovascular:     Rate and Rhythm: Normal rate and regular rhythm.  Pulmonary:     Effort: Pulmonary effort is normal.     Breath sounds: Normal breath sounds. No wheezing, rhonchi or rales.  Abdominal:     General: There is no distension.     Palpations: Abdomen is soft.     Tenderness: There is no abdominal tenderness.  Neurological:     Mental Status: She is alert.     UC Treatments / Results  Labs (all labs ordered are listed, but only abnormal results are displayed) Labs Reviewed  POCT URINALYSIS DIP (DEVICE) - Abnormal; Notable for the  following components:      Result Value   Bilirubin Urine SMALL (*)    Ketones, ur TRACE (*)    Hgb urine dipstick LARGE (*)    Protein, ur 100 (*)    Leukocytes,Ua LARGE (*)    All other components within normal limits  URINE CULTURE  POCT URINALYSIS DIPSTICK, ED / UC    EKG   Radiology No results found.  Procedures Procedures (including critical care time)  Medications Ordered in UC Medications - No data to display  Initial Impression / Assessment and Plan / UC Course  I have reviewed the triage vital signs and the nursing notes.  Pertinent labs & imaging results that were available during my care of the patient were reviewed by me and considered in my medical decision making (see chart for details).    20 year old female presents with UTI.  Sending culture.  Placing on Keflex.  Final Clinical Impressions(s) / UC Diagnoses   Final diagnoses:  Acute cystitis with hematuria   Discharge Instructions   None    ED Prescriptions     Medication Sig Dispense Auth. Provider   cephALEXin (KEFLEX) 500 MG capsule Take 1 capsule (500 mg total) by mouth 2 (two) times daily. 14 capsule 21 G, DO      PDMP not reviewed this encounter.   Everlene Other, Tommie Sams 10/05/20 2019

## 2020-10-05 NOTE — ED Triage Notes (Signed)
Pt here with C/O urine problems, burning frequency for 2 days.

## 2020-10-08 LAB — URINE CULTURE: Culture: >=100000 — AB

## 2020-10-16 ENCOUNTER — Other Ambulatory Visit: Payer: Self-pay

## 2020-10-16 ENCOUNTER — Ambulatory Visit
Admission: EM | Admit: 2020-10-16 | Discharge: 2020-10-16 | Disposition: A | Discharge status: Home / Self Care | Payer: 59 | Attending: Medical Oncology | Admitting: Medical Oncology

## 2020-10-16 DIAGNOSIS — N3001 Acute cystitis with hematuria: Secondary | ICD-10-CM | POA: Diagnosis present

## 2020-10-16 DIAGNOSIS — R300 Dysuria: Secondary | ICD-10-CM | POA: Diagnosis present

## 2020-10-16 LAB — POCT URINALYSIS DIP (DEVICE)
Glucose, UA: NEGATIVE mg/dL
Nitrite: POSITIVE — AB
Protein, ur: >=300 mg/dL — AB
Specific Gravity, Urine: >=1.03 (ref 1.005–1.030)
Urobilinogen, UA: 1 mg/dL (ref 0.0–1.0)
pH: 6 (ref 5.0–8.0)

## 2020-10-16 MED ORDER — ONDANSETRON 8 MG PO TBDP: 8.0000 mg | ORAL_TABLET | Freq: Three times a day (TID) | ORAL | 0 refills | Status: AC | PRN

## 2020-10-16 MED ORDER — CIPROFLOXACIN HCL 500 MG PO TABS: 500.0000 mg | ORAL_TABLET | Freq: Two times a day (BID) | ORAL | 0 refills | Status: AC

## 2020-10-16 NOTE — Discharge Instructions (Signed)

## 2020-10-16 NOTE — ED Provider Notes (Signed)
[facility]    CSN: [id number] Arrival date & time: 10/16/20  1407      History   Chief Complaint Chief Complaint  Patient presents with   Dysuria    HPI Claire Allen is a 20 y.o. female presenting for mood dysuria, urinary frequency and hematuria.  Patient was seen at [facility] urgent care on 10/05/2020 for UTI.  She was treated at that time with 1 week course of Keflex.  Patient states symptoms have not gone away.  He says they seem to get better but then she started her period and symptoms seem to have gotten worse.  Patient states that now she is off of her period.  She admits that her menstrual periods are irregular.  Admits to recurrent UTIs and says that she has multiple a year.  She has been seen by [facility] in the past.  Patient says that she normally takes Keflex when she gets UTIs and it generally works for her.  She denies any fevers.  She has had some lower back discomfort in the center.  Also admits to lower abdominal pressure.  Nausea without vomiting present.  No other complaints.  HPI  Past Medical History:  Diagnosis Date   Acid reflux    Anxiety    Depression     Patient Active Problem List   Diagnosis Date Noted   Dysmenorrhea 07/03/2016   Anxiety and depression 06/27/2016   Patient is Jehovah's Witness 11/11/2015   Other constipation 11/04/2015   Acid reflux 10/13/2015    Past Surgical History:  Procedure Laterality Date   NO PAST SURGERIES      OB History     Gravida  0   Para  0   Term  0   Preterm  0   AB  0   Living  0      SAB  0   IAB  0   Ectopic  0   Multiple  0   Live Births  0            Home Medications    Prior to Admission medications   Medication Sig Start Date End Date Taking? Authorizing Provider  ciprofloxacin (CIPRO) 500 MG tablet Take 1 tablet (500 mg total) by mouth every 12 (twelve) hours for 7 days. 10/16/20 10/23/20 Yes Eusebio Friendly B, PA-C  ondansetron (ZOFRAN  ODT) 8 MG disintegrating tablet Take 1 tablet (8 mg total) by mouth every 8 (eight) hours as needed for up to 7 days for nausea or vomiting. 10/16/20 10/23/20 Yes Eusebio Friendly B, PA-C  Norethindrone Acetate-Ethinyl Estrad-FE (LOESTRIN 24 FE) 1-20 MG-MCG(24) tablet Take 1 tablet by mouth daily. 07/03/16   Reva Bores, MD  fluticasone (FLONASE) 50 MCG/ACT nasal spray Place 2 sprays into both nostrils daily. 12/09/19 12/15/19  Tommie Sams, DO    Family History Family History  Problem Relation Age of Onset   Heart attack Maternal Grandmother    Hypertension Maternal Grandmother    Hypertension Maternal Grandfather    Endometriosis Mother    Hypertension Father    Hypertension Maternal Uncle    Heart attack Maternal Uncle     Social History Social History   Tobacco Use   Smoking status: Never   Smokeless tobacco: Never  Vaping Use   Vaping Use: Never used  Substance Use Topics   Alcohol use: Yes    Comment: social   Drug use: No     Allergies   Patient  has no known allergies.   Review of Systems Review of Systems  Constitutional:  Negative for chills, fatigue and fever.  Gastrointestinal:  Positive for abdominal pain and nausea. Negative for diarrhea and vomiting.  Genitourinary:  Positive for dysuria, frequency, hematuria and urgency. Negative for decreased urine volume, flank pain, pelvic pain, vaginal bleeding, vaginal discharge and vaginal pain.  Musculoskeletal:  Positive for back pain.  Skin:  Negative for rash.    Physical Exam Triage Vital Signs ED Triage Vitals  Enc Vitals Group     BP      Pulse      Resp      Temp      Temp src      SpO2      Weight      Height      Head Circumference      Peak Flow      Pain Score      Pain Loc      Pain Edu?      Excl. in GC?    No data found.  Updated Vital Signs BP 114/78 (BP Location: Left Arm)   Pulse 78   Temp 98 F (36.7 C) (Oral)   Resp 16   Ht 5\' 2"  (1.575 m)   Wt 112 lb 14 oz (51.2 kg)    LMP 10/12/2020   SpO2 100%   BMI 20.65 kg/m      Physical Exam Vitals and nursing note reviewed.  Constitutional:      General: She is not in acute distress.    Appearance: Normal appearance. She is not ill-appearing or toxic-appearing.  HENT:     Head: Normocephalic and atraumatic.  Eyes:     General: No scleral icterus.       Right eye: No discharge.        Left eye: No discharge.     Conjunctiva/sclera: Conjunctivae normal.  Cardiovascular:     Rate and Rhythm: Normal rate and regular rhythm.     Heart sounds: Normal heart sounds.  Pulmonary:     Effort: Pulmonary effort is normal. No respiratory distress.     Breath sounds: Normal breath sounds.  Abdominal:     Palpations: Abdomen is soft.     Tenderness: There is abdominal tenderness (mild suprapubic TTP). There is no right CVA tenderness or left CVA tenderness.  Musculoskeletal:     Cervical back: Neck supple.  Skin:    General: Skin is dry.  Neurological:     General: No focal deficit present.     Mental Status: She is alert. Mental status is at baseline.     Motor: No weakness.     Gait: Gait normal.  Psychiatric:        Mood and Affect: Mood normal.        Behavior: Behavior normal.        Thought Content: Thought content normal.     UC Treatments / Results  Labs (all labs ordered are listed, but only abnormal results are displayed) Labs Reviewed  POCT URINALYSIS DIP (DEVICE) - Abnormal; Notable for the following components:      Result Value   Bilirubin Urine MODERATE (*)    Ketones, ur TRACE (*)    Hgb urine dipstick LARGE (*)    Protein, ur >=300 (*)    Nitrite POSITIVE (*)    Leukocytes,Ua LARGE (*)    All other components within normal limits  URINE CULTURE    EKG  Radiology No results found.  Procedures Procedures (including critical care time)  Medications Ordered in UC Medications - No data to display  Initial Impression / Assessment and Plan / UC Course  I have reviewed the  triage vital signs and the nursing notes.  Pertinent labs & imaging results that were available during my care of the patient were reviewed by me and considered in my medical decision making (see chart for details).  20 year old female returning for continued UTI symptoms.  Patient seen 11 days ago and treated with 7-week course of Keflex.  Today, she is afebrile and overall well-appearing.  Mild suprapubic tenderness palpation.  No CVA tenderness.  UA today shows moderate bili, trace ketones, large hemoglobin, protein, positive nitrites and large leukocytes. Urine culture sent. Reviewed urine culture that was sent on 10/5.  This shows resistance to ampicillin/sulbactam and Bactrim.  Since patient has had continued symptoms despite 1 week of Keflex, switching her to Cipro at this time.  Advised her to increase rest and fluid intake.  Sent Zofran for nausea.  Advised her to reach back out to [facility] for another appointment since its been over a year since she has been seen.  ED precautions reviewed.  Final Clinical Impressions(s) / UC Diagnoses   Final diagnoses:  Acute cystitis with hematuria  Dysuria     Discharge Instructions      UTI: Based on either symptoms or urinalysis, you may have a urinary tract infection. We will send the urine for culture and call with results in a few days. Begin antibiotics at this time. Your symptoms should be much improved over the next 2-3 days. Increase rest and fluid intake. If for some reason symptoms are worsening or not improving after a couple of days or the urine culture determines the antibiotics you are taking will not treat the infection, the antibiotics may be changed. Return or go to ER for fever, back pain, worsening urinary pain, discharge, increased blood in urine. May take Tylenol or Motrin OTC for pain relief or consider AZO if no contraindications      ED Prescriptions     Medication Sig Dispense Auth. Provider    ciprofloxacin (CIPRO) 500 MG tablet Take 1 tablet (500 mg total) by mouth every 12 (twelve) hours for 7 days. 14 tablet Eusebio Friendly B, PA-C   ondansetron (ZOFRAN ODT) 8 MG disintegrating tablet Take 1 tablet (8 mg total) by mouth every 8 (eight) hours as needed for up to 7 days for nausea or vomiting. 20 tablet Gareth Silvers      PDMP not reviewed this encounter.   Shirlee Latch, PA-C 10/16/20 1506

## 2020-10-16 NOTE — ED Triage Notes (Signed)
Pt seen here on 10/5 for UTI. Took Keflex but states "It didn't seem like there were enough pills in the bottle."  States her UTI sx have not resolved and she has started her menses. Dysuria and low back pain.

## 2020-10-18 LAB — URINE CULTURE: Culture: >=100000 — AB

## 2020-10-28 ENCOUNTER — Encounter: Payer: Self-pay | Admitting: Physician Assistant

## 2020-10-28 ENCOUNTER — Ambulatory Visit (INDEPENDENT_AMBULATORY_CARE_PROVIDER_SITE_OTHER): Payer: BC Managed Care – PPO | Admitting: Physician Assistant

## 2020-10-28 ENCOUNTER — Other Ambulatory Visit: Payer: Self-pay

## 2020-10-28 VITALS — BP 119/77 | HR 96 | Ht 62.0 in | Wt 105.1 lb

## 2020-10-28 DIAGNOSIS — N390 Urinary tract infection, site not specified: Secondary | ICD-10-CM

## 2020-10-28 LAB — MICROSCOPIC EXAMINATION

## 2020-10-28 LAB — URINALYSIS, COMPLETE
Bilirubin, UA: NEGATIVE
Glucose, UA: NEGATIVE
Ketones, UA: NEGATIVE
Leukocytes,UA: NEGATIVE
Nitrite, UA: NEGATIVE
Protein,UA: NEGATIVE
Specific Gravity, UA: 1.025 (ref 1.005–1.030)
Urobilinogen, Ur: 0.2 mg/dL (ref 0.2–1.0)
pH, UA: 5 (ref 5.0–7.5)

## 2020-10-28 MED ORDER — CEPHALEXIN 500 MG PO CAPS: ORAL_CAPSULE | ORAL | 4 refills | Status: DC

## 2020-10-28 NOTE — Progress Notes (Signed)
10/28/2020 1:35 PM   Claire Allen Nov 03, 2000 [id number]  CC: Chief Complaint  Patient presents with   Recurrent UTI   HPI: Claire Allen is a 20 y.o. female with PMH recurrent UTI previously prescribed postcoital prophylactic Keflex by Dr. Apolinar Junes who presents today for annual follow-up and medication refill.   Today she reports 3 UTIs in the past year, all with ampicillin and Bactrim resistant E. coli.  2 of these occurred within the past month, though she is asymptomatic today.  She does note some correlation between UTI symptoms and menstruation.  She ran out of her postcoital antibiotic several months after seeing Dr. Apolinar Junes last year, so she has been off this therapy.  In-office UA today positive for 2+ blood; urine microscopy with granular casts.  PMH: Past Medical History:  Diagnosis Date   Acid reflux    Anxiety    Depression    Urinary tract infection     Surgical History: Past Surgical History:  Procedure Laterality Date   NO PAST SURGERIES      Home Medications:  Allergies as of 10/28/2020   No Known Allergies      Medication List        Accurate as of October 28, 2020  1:35 PM. If you have any questions, ask your nurse or doctor.          cephALEXin 500 MG capsule Commonly known as: Keflex Take one capsule as needed after sex for UTI prevention. Started by: Carman Ching, PA-C   Norethindrone Acetate-Ethinyl Estrad-FE 1-20 MG-MCG(24) tablet Commonly known as: LOESTRIN 24 FE Take 1 tablet by mouth daily.        Allergies:  No Known Allergies  Family History: Family History  Problem Relation Age of Onset   Heart attack Maternal Grandmother    Hypertension Maternal Grandmother    Hypertension Maternal Grandfather    Endometriosis Mother    Hypertension Father    Hypertension Maternal Uncle    Heart attack Maternal Uncle     Social History:   reports that she has never smoked. She has never used smokeless tobacco.  She reports current alcohol use. She reports that she does not use drugs.  Physical Exam: BP 119/77 (BP Location: Left Arm, Patient Position: Sitting, Cuff Size: Normal)   Pulse 96   Ht 5\' 2"  (1.575 m)   Wt 105 lb 1.6 oz (47.7 kg)   LMP 10/27/2020   BMI 19.22 kg/m   Constitutional:  Alert and oriented, no acute distress, nontoxic appearing HEENT: Bent, AT Cardiovascular: No clubbing, cyanosis, or edema Respiratory: Normal respiratory effort, no increased work of breathing Skin: No rashes, bruises or suspicious lesions Neurologic: Grossly intact, no focal deficits, moving all 4 extremities Psychiatric: Normal mood and affect  Laboratory Data: Results for orders placed or performed in visit on 10/28/20  Microscopic Examination   Urine  Result Value Ref Range   WBC, UA 0-5 0 - 5 /hpf   RBC 0-2 0 - 2 /hpf   Epithelial Cells (non renal) 0-10 0 - 10 /hpf   Casts Present (A) None seen /lpf   Cast Type Granular casts (A) N/A   Mucus, UA Present (A) Not Estab.   Bacteria, UA Few None seen/Few  Urinalysis, Complete  Result Value Ref Range   Specific Gravity, UA 1.025 1.005 - 1.030   pH, UA 5.0 5.0 - 7.5   Color, UA Yellow Yellow   Appearance Ur Cloudy (A) Clear   Leukocytes,UA Negative Negative  Protein,UA Negative Negative/Trace   Glucose, UA Negative Negative   Ketones, UA Negative Negative   RBC, UA 2+ (A) Negative   Bilirubin, UA Negative Negative   Urobilinogen, Ur 0.2 0.2 - 1.0 mg/dL   Nitrite, UA Negative Negative   Microscopic Examination See below:    Assessment & Plan:   1. Recurrent urinary tract infection Not clinically infected today, UA benign.  Okay to resume postcoital prophylaxis with Keflex.  We will plan for annual follow-up and discussed that we are available for same-day visits if she has a breakthrough infection in the interim.  She expressed understanding. - Urinalysis, Complete - cephALEXin (KEFLEX) 500 MG capsule; Take one capsule as needed after sex  for UTI prevention.  Dispense: 15 capsule; Refill: 4  Return for Annual rUTI follow-up.  Carman Ching, PA-C  [facility] [address] [place], [place] [address] [phone]

## 2020-11-14 ENCOUNTER — Ambulatory Visit
Admission: EM | Admit: 2020-11-14 | Discharge: 2020-11-14 | Disposition: A | Discharge status: Home / Self Care | Payer: 59 | Attending: Emergency Medicine | Admitting: Emergency Medicine

## 2020-11-14 ENCOUNTER — Encounter: Payer: Self-pay | Admitting: Emergency Medicine

## 2020-11-14 ENCOUNTER — Other Ambulatory Visit: Payer: Self-pay

## 2020-11-14 DIAGNOSIS — L03031 Cellulitis of right toe: Secondary | ICD-10-CM

## 2020-11-14 DIAGNOSIS — L600 Ingrowing nail: Secondary | ICD-10-CM

## 2020-11-14 MED ORDER — CEPHALEXIN 500 MG PO CAPS: 500.0000 mg | ORAL_CAPSULE | Freq: Three times a day (TID) | ORAL | 0 refills | Status: AC

## 2020-11-14 NOTE — Discharge Instructions (Signed)
Take the Keflex 3 times a day for 5 days for treatment of your infected toe.  Soak your foot in warm water and Epsom salts twice daily.  Use an orange stick to work a Administrator, sports ball coated in Bacitracin underneath your nail edge to lift the edge so the nail grows out and not in.  Keep your appointment with Podiatry.  Always cut your nails off square to prevent them growing in.

## 2020-11-14 NOTE — ED Provider Notes (Signed)
[facility]    CSN: [id number] Arrival date & time: 11/14/20  1208      History   Chief Complaint Chief Complaint  Patient presents with   Toe Pain    Right big toe    HPI Claire Allen is a 20 y.o. female.   HPI  20 year old female here for evaluation of right big toe pain.  Patient ports that she has been experiencing right big toe pain with redness and swelling for the past 3 days.  She states that she thought her toenail was becoming ingrown so she clipped it back very short and that has made the redness and swelling worse.  It is now exuding pus.  She has an appoint with podiatrist but is not till the end of the month.  Past Medical History:  Diagnosis Date   Acid reflux    Anxiety    Depression    Urinary tract infection     Patient Active Problem List   Diagnosis Date Noted   Dysmenorrhea 07/03/2016   Anxiety and depression 06/27/2016   Patient is Jehovah's Witness 11/11/2015   Other constipation 11/04/2015   Acid reflux 10/13/2015    Past Surgical History:  Procedure Laterality Date   NO PAST SURGERIES      OB History     Gravida  0   Para  0   Term  0   Preterm  0   AB  0   Living  0      SAB  0   IAB  0   Ectopic  0   Multiple  0   Live Births  0            Home Medications    Prior to Admission medications   Medication Sig Start Date End Date Taking? Authorizing Provider  cephALEXin (KEFLEX) 500 MG capsule Take 1 capsule (500 mg total) by mouth 3 (three) times daily for 5 days. 11/14/20 11/19/20 Yes Becky Augusta, NP  Norethindrone Acetate-Ethinyl Estrad-FE (LOESTRIN 24 FE) 1-20 MG-MCG(24) tablet Take 1 tablet by mouth daily. 07/03/16   Reva Bores, MD  fluticasone (FLONASE) 50 MCG/ACT nasal spray Place 2 sprays into both nostrils daily. 12/09/19 12/15/19  Tommie Sams, DO    Family History Family History  Problem Relation Age of Onset   Heart attack Maternal Grandmother    Hypertension Maternal  Grandmother    Hypertension Maternal Grandfather    Endometriosis Mother    Hypertension Father    Hypertension Maternal Uncle    Heart attack Maternal Uncle     Social History Social History   Tobacco Use   Smoking status: Never   Smokeless tobacco: Never  Vaping Use   Vaping Use: Never used  Substance Use Topics   Alcohol use: Yes    Comment: social   Drug use: No     Allergies   Patient has no known allergies.   Review of Systems Review of Systems  Constitutional:  Negative for activity change, appetite change and fever.  Musculoskeletal:  Negative for arthralgias and joint swelling.  Skin:  Positive for color change.  Neurological:  Negative for numbness.  Hematological: Negative.   Psychiatric/Behavioral: Negative.      Physical Exam Triage Vital Signs ED Triage Vitals  Enc Vitals Group     BP --      Pulse Rate 11/14/20 1248 84     Resp 11/14/20 1248 16     Temp 11/14/20 1248 98.1  F (36.7 C)     Temp Source 11/14/20 1248 Oral     SpO2 11/14/20 1248 100 %     Weight --      Height --      Head Circumference --      Peak Flow --      Pain Score 11/14/20 1246 8     Pain Loc --      Pain Edu? --      Excl. in GC? --    No data found.  Updated Vital Signs Pulse 84   Temp 98.1 F (36.7 C) (Oral)   Resp 16   LMP 10/27/2020   SpO2 100%   Visual Acuity Right Eye Distance:   Left Eye Distance:   Bilateral Distance:    Right Eye Near:   Left Eye Near:    Bilateral Near:     Physical Exam Vitals and nursing note reviewed.  Constitutional:      General: She is not in acute distress.    Appearance: Normal appearance. She is normal weight. She is not ill-appearing.  Skin:    General: Skin is warm and dry.     Capillary Refill: Capillary refill takes less than 2 seconds.     Findings: Erythema present. No bruising.  Neurological:     General: No focal deficit present.     Mental Status: She is alert and oriented to person, place, and time.   Psychiatric:        Mood and Affect: Mood normal.        Behavior: Behavior normal.        Thought Content: Thought content normal.        Judgment: Judgment normal.     UC Treatments / Results  Labs (all labs ordered are listed, but only abnormal results are displayed) Labs Reviewed - No data to display  EKG   Radiology No results found.  Procedures Procedures (including critical care time)  Medications Ordered in UC Medications - No data to display  Initial Impression / Assessment and Plan / UC Course  I have reviewed the triage vital signs and the nursing notes.  Pertinent labs & imaging results that were available during my care of the patient were reviewed by me and considered in my medical decision making (see chart for details).  Patient is a nontoxic-appearing 20 year old female here for evaluation of left great toe pain and swelling has been going on for the past 3 days.  Patient physical exam reveals erythema on the proximal and lateral edge of the right great toenail.  There is some tan pus seeping from the distal aspect of the toenail.  The patient has got the toenail back sharply at an angle.  There is no fluctuance or induration noted.  Patient has an ingrown toenail that has become infected.  Patient does have an appointment with podiatry and I have encouraged her to keep that.  In the meantime I will treat her with Keflex 3 times daily for the soft tissue infection.  I have encouraged her to soak her foot in warm water and Epsom salts twice daily to help keep the skin supple and after each soaking to use a small piece of a cottonball coated in bacitracin and an orange stick to lift at the leading edge of the nail up so that it grows out and does not continue to grow in.   Final Clinical Impressions(s) / UC Diagnoses   Final diagnoses:  Ingrown nail  of great toe of right foot  Cellulitis of toe of right foot     Discharge Instructions      Take the Keflex  3 times a day for 5 days for treatment of your infected toe.  Soak your foot in warm water and Epsom salts twice daily.  Use an orange stick to work a Administrator, sports ball coated in Bacitracin underneath your nail edge to lift the edge so the nail grows out and not in.  Keep your appointment with Podiatry.  Always cut your nails off square to prevent them growing in.     ED Prescriptions     Medication Sig Dispense Auth. Provider   cephALEXin (KEFLEX) 500 MG capsule Take 1 capsule (500 mg total) by mouth 3 (three) times daily for 5 days. 15 capsule Becky Augusta, NP      PDMP not reviewed this encounter.   Becky Augusta, NP 11/14/20 [phone]

## 2020-11-14 NOTE — ED Triage Notes (Signed)
Pt presents today with pain to right big toe that she believes is from her clipping it too far( x 3 days).

## 2020-11-16 ENCOUNTER — Other Ambulatory Visit: Payer: Self-pay

## 2020-11-16 ENCOUNTER — Ambulatory Visit (INDEPENDENT_AMBULATORY_CARE_PROVIDER_SITE_OTHER): Payer: 59 | Admitting: Podiatry

## 2020-11-16 ENCOUNTER — Encounter (INDEPENDENT_AMBULATORY_CARE_PROVIDER_SITE_OTHER): Payer: Self-pay

## 2020-11-16 ENCOUNTER — Encounter: Payer: Self-pay | Admitting: Podiatry

## 2020-11-16 DIAGNOSIS — L600 Ingrowing nail: Secondary | ICD-10-CM

## 2020-11-16 MED ORDER — NEOMYCIN-POLYMYXIN-HC 1 % OT SOLN: OTIC | 1 refills | Status: AC

## 2020-11-16 NOTE — Progress Notes (Signed)
  Subjective:  Patient ID: Claire Allen, female    DOB: 2000-12-05,  MRN: [id number] HPI Chief Complaint  Patient presents with   Toe Pain    Hallux right - both borders - ingrown, red, swollen, some drainage x few days, Urgent Care eval-rx'd antibiotic and using bacitracin    New Patient (Initial Visit)    20 y.o. female presents with the above complaint.   ROS: Denies fever chills nausea vomiting muscle aches pains calf pain back pain chest pain shortness of breath.  Past Medical History:  Diagnosis Date   Acid reflux    Anxiety    Depression    Urinary tract infection    Past Surgical History:  Procedure Laterality Date   NO PAST SURGERIES      Current Outpatient Medications:    NEOMYCIN-POLYMYXIN-HYDROCORTISONE (CORTISPORIN) 1 % SOLN OTIC solution, Apply 1-2 drops to toe BID after soaking, Disp: 10 mL, Rfl: 1   cephALEXin (KEFLEX) 500 MG capsule, Take 1 capsule (500 mg total) by mouth 3 (three) times daily for 5 days., Disp: 15 capsule, Rfl: 0   TRI-LO-MARZIA 0.18/0.215/0.25 MG-25 MCG tab, Take 1 tablet by mouth daily., Disp: , Rfl:   No Known Allergies Review of Systems Objective:  There were no vitals filed for this visit.  General: Well developed, nourished, in no acute distress, alert and oriented x3   Dermatological: Skin is warm, dry and supple bilateral. Nails x 10 are well maintained; remaining integument appears unremarkable at this time. There are no open sores, no preulcerative lesions, no rash or signs of infection present.  Ingrown nail fibular border hallux right does not demonstrate purulence or malodor is mild erythema and tenderness on palpation.  The nail plate itself appears to be somewhat dystrophic there is a transverse ridge distally in the nail does have some characteristics of the onychocryptosis when compared to the other nails.  Vascular: Dorsalis Pedis artery and Posterior Tibial artery pedal pulses are 2/4 bilateral with immedate capillary fill  time. Pedal hair growth present. No varicosities and no lower extremity edema present bilateral.   Neruologic: Grossly intact via light touch bilateral. Vibratory intact via tuning fork bilateral. Protective threshold with Semmes Wienstein monofilament intact to all pedal sites bilateral. Patellar and Achilles deep tendon reflexes 2+ bilateral. No Babinski or clonus noted bilateral.   Musculoskeletal: No gross boney pedal deformities bilateral. No pain, crepitus, or limitation noted with foot and ankle range of motion bilateral. Muscular strength 5/5 in all groups tested bilateral.  Gait: Unassisted, Nonantalgic.    Radiographs:  None taken  Assessment & Plan:   Assessment: Ingrown toenail fibular border hallux right  Plan: Chemical matricectomy was performed today after local anesthetic was administered.  She tolerated procedure well.  Was provided with both oral and home-going instructions for care and soaking of the toe as well as a prescription for Cortisporin Otic to be applied twice daily after soaking cover during the daytime and I will follow-up with her in 2 weeks     Lakara Weiland T. [place], [place]

## 2020-11-16 NOTE — Patient Instructions (Signed)

## 2020-11-21 ENCOUNTER — Telehealth: Payer: Self-pay | Admitting: Podiatry

## 2020-11-21 NOTE — Telephone Encounter (Signed)
Patient called stating she had a ingrown toe nail procedure. Patient has some questions about her instructions and soaking her foot. Patient wants to know how long should she be doing it. Please call pt at [phone].

## 2020-11-22 ENCOUNTER — Telehealth: Payer: Self-pay | Admitting: *Deleted

## 2020-11-22 NOTE — Telephone Encounter (Signed)
I am returning your call.  How can I help you?  "I am so confused.  I spoke to a nurse on yesterday.  She told me to let my toenail air out without a bandage and told me to put one on if I go out.  Should I be doing that?  I just had the surgery done four days ago.  How many days do I soak it?"  You are to continue doing the soaks and dressings as long as you have drainage.  The drainage could last for two weeks or more.  It's normal.  You may notice bleeding or drainage for a while, that is normal.  He killed the root of your nail with acid, that causes an actual burn.  While you are at home relaxing during the night, just let it breath without a band-aid for a couple of hours.  Put it back on before you go to bed.  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  Should I go without a band-aid all day?"  Do not do that because you don't want it to get infected.  Just let it breathe for a couple of hours only.  It will eventually start to scab up.

## 2020-11-22 NOTE — Telephone Encounter (Signed)
"  I spoke with a nurse yesterday about some information I was confused on.  I have a couple more questions about what to do.  Give me a call back."

## 2020-11-23 ENCOUNTER — Other Ambulatory Visit: Payer: Self-pay

## 2020-11-23 ENCOUNTER — Encounter: Payer: Self-pay | Admitting: Urology

## 2020-11-23 ENCOUNTER — Ambulatory Visit (INDEPENDENT_AMBULATORY_CARE_PROVIDER_SITE_OTHER): Payer: 59 | Admitting: Urology

## 2020-11-23 VITALS — BP 120/82 | HR 111 | Ht 62.0 in | Wt 106.0 lb

## 2020-11-23 DIAGNOSIS — N390 Urinary tract infection, site not specified: Secondary | ICD-10-CM

## 2020-11-23 NOTE — Patient Instructions (Signed)
Nature conservation officer at [email or web address] [address] [place], [place] [address]  ~20.2 mi [phone]  [facility] [email or web address] [address], [place], [place] [address]  ~22 mi [phone]  [facility]   [phone].

## 2020-11-23 NOTE — Progress Notes (Signed)
11/23/2020 3:03 PM   Claire Allen Claire Allen 12/19/2000 [id number]  Referring provider: [facility] [place],  [place] [address]  Chief Complaint  Patient presents with   Dysuria   Urological history 1. rUTI's -contributing factors of sexually active -RUS 2021 - WNL  -documented positive urine cultures over the last year  12/15/2019 E.coli  10/05/2020 E.coli  10/16/2020 E.coli  HPI: Claire Allen is a 20 y.o. female who presents today for bladder frequency and bladder pressure with her mother, Hinton Dyer.  She has been experiencing urinary frequency, urgency and difficulty urinating for the last few days.  Patient denies any modifying or aggravating factors.  Patient denies any gross hematuria, dysuria or suprapubic/flank pain.  Patient denies any fevers, chills, nausea or vomiting.    She is having lower abdominal pain which is sensitive to touch and states that she can feel "bubbles" under the skin.  She is concerned that she may have emphysematous cystitis.  She also has been noting blood in her stool.  She states she drinks a lot of water.  UA is benign.      PMH: Past Medical History:  Diagnosis Date   Acid reflux    Anxiety    Depression    Urinary tract infection     Surgical History: Past Surgical History:  Procedure Laterality Date   NO PAST SURGERIES      Home Medications:  Allergies as of 11/23/2020   No Known Allergies      Medication List        Accurate as of November 23, 2020 11:59 PM. If you have any questions, ask your nurse or doctor.          NEOMYCIN-POLYMYXIN-HYDROCORTISONE 1 % Soln OTIC solution Commonly known as: CORTISPORIN Apply 1-2 drops to toe BID after soaking   PARoxetine 20 MG tablet Commonly known as: PAXIL Take 20 mg by mouth daily.   Tri-Lo-Marzia 0.18/0.215/0.25 MG-25 MCG tab Generic drug: Norgestimate-Ethinyl Estradiol Triphasic Take 1 tablet by mouth daily.        Allergies: No Known  Allergies  Family History: Family History  Problem Relation Age of Onset   Heart attack Maternal Grandmother    Hypertension Maternal Grandmother    Hypertension Maternal Grandfather    Endometriosis Mother    Hypertension Father    Hypertension Maternal Uncle    Heart attack Maternal Uncle     Social History:  reports that she has never smoked. She has never used smokeless tobacco. She reports current alcohol use. She reports that she does not use drugs.  ROS: Pertinent ROS in HPI  Physical Exam: BP 120/82   Pulse (!) 111   Ht '5\' 2"'  (1.575 m)   Wt 106 lb (48.1 kg)   LMP 10/27/2020   BMI 19.39 kg/m   Constitutional:  Well nourished. Alert and oriented, No acute distress. HEENT: [place] AT, mask in place.  Trachea midline Cardiovascular: No clubbing, cyanosis, or edema. Respiratory: Normal respiratory effort, no increased work of breathing. Neurologic: Grossly intact, no focal deficits, moving all 4 extremities. Psychiatric: Normal mood and affect.    Laboratory Data: Urinalysis Component     Latest Ref Rng & Units 11/23/2020  Specific Gravity, UA     1.005 - 1.030 >1.030 (H)  pH, UA     5.0 - 7.5 5.5  Color, UA     Yellow Orange  Appearance Ur     Clear Hazy (A)  Leukocytes,UA  Negative Negative  Protein,UA     Negative/Trace 1+ (A)  Glucose, UA     Negative Negative  Ketones, UA     Negative 1+ (A)  RBC, UA     Negative Trace (A)  Bilirubin, UA     Negative Negative  Urobilinogen, Ur     0.2 - 1.0 mg/dL 0.2  Nitrite, UA     Negative Negative  Microscopic Examination      See below:   Component     Latest Ref Rng & Units 11/23/2020  WBC, UA     0 - 5 /hpf 0-5  RBC     0 - 2 /hpf 0-2  Epithelial Cells (non renal)     0 - 10 /hpf 0-10  Casts     None seen /lpf Present (A)  Cast Type     N/A Hyaline casts  Mucus, UA     Not Estab. Present (A)  Bacteria, UA     None seen/Few None seen  I have reviewed the labs.   Pertinent  Imaging: N/A  Assessment & Plan:    1. rUTI -UA benign -Urine culture -criteria for recurrent UTI has been met with 2 or more infections in 6 months or 3 or greater infections in one year  - patient is to continue to increase their water intake until the urine is pale yellow or clear (10 to 12 cups daily)  - patient is instructed to take probiotics (yogurt, oral pills or vaginal suppositories), take cranberry pills or drink the juice and Vitamin C 1,000 mg daily to acidify the urine  - if using tampons, she should remove them prior to urinating and change them often  - avoid soaking in tubs and wipe front to back after urinating  -Reassured the patient that she did not have emphysematous cystitis as the air in the bladder cannot be palpated through the abdomen and that her urine was benign  2. Lower abdominal pain -Explained to her that as her UA is benign and the symptoms she describing are more likely associated with the gastro intestinal etiology versus neurological and encouraged her to follow-up with her PCP for further evaluation -Gave her phone numbers to practices in the area that are accepting new patients                                             Return for pending urine culture results .  These notes generated with voice recognition software. I apologize for typographical errors.  Zara Council, PA-C  [facility] [address]  [place], [place] [address] [phone]  I spent 25 minutes on the day of the encounter to include pre-visit record review, face-to-face time with the patient, and post-visit ordering of tests.

## 2020-11-24 LAB — URINALYSIS, COMPLETE
Bilirubin, UA: NEGATIVE
Glucose, UA: NEGATIVE
Leukocytes,UA: NEGATIVE
Nitrite, UA: NEGATIVE
Specific Gravity, UA: >1.03 — ABNORMAL HIGH (ref 1.005–1.030)
Urobilinogen, Ur: 0.2 mg/dL (ref 0.2–1.0)
pH, UA: 5.5 (ref 5.0–7.5)

## 2020-11-24 LAB — MICROSCOPIC EXAMINATION: Bacteria, UA: NONE SEEN

## 2020-11-28 LAB — CULTURE, URINE COMPREHENSIVE

## 2020-11-30 ENCOUNTER — Other Ambulatory Visit: Payer: Self-pay

## 2020-11-30 ENCOUNTER — Ambulatory Visit (INDEPENDENT_AMBULATORY_CARE_PROVIDER_SITE_OTHER): Payer: 59 | Admitting: Podiatry

## 2020-11-30 ENCOUNTER — Encounter: Payer: Self-pay | Admitting: Podiatry

## 2020-11-30 DIAGNOSIS — L600 Ingrowing nail: Secondary | ICD-10-CM

## 2020-11-30 DIAGNOSIS — Z9889 Other specified postprocedural states: Secondary | ICD-10-CM

## 2020-11-30 NOTE — Progress Notes (Signed)
She presents today for follow-up of her nail procedure hallux right.  States that she has been alternating Epson salts and Betadine every other day follow-up for soaking.  States that it does not hurt anymore and now she still soaking and covering it.  Objective: Vital signs are stable she alert and oriented x3 there is no erythema minimal edema no cellulitis drainage or odor.  Some tenderness on palpation.  Assessment: Well-healing surgical toe hallux right.  Plan: Encouraged her to soak every other day Epson salts and warm water cover during the day leave open at bedtime continue the use of the Cortisporin otic until completely out follow-up with me in 2 weeks if not well.

## 2020-12-01 IMAGING — US US RENAL
1 series · 14 of 25 positions shown · non-contrast
Comparison: CT 04/08/2013

CLINICAL DATA: Recurrent UTI

EXAM:
RENAL / URINARY TRACT ULTRASOUND COMPLETE

[Series 1: us renal · 0.23mm/px · 14 of 37 slices shown]
[im 1/37]
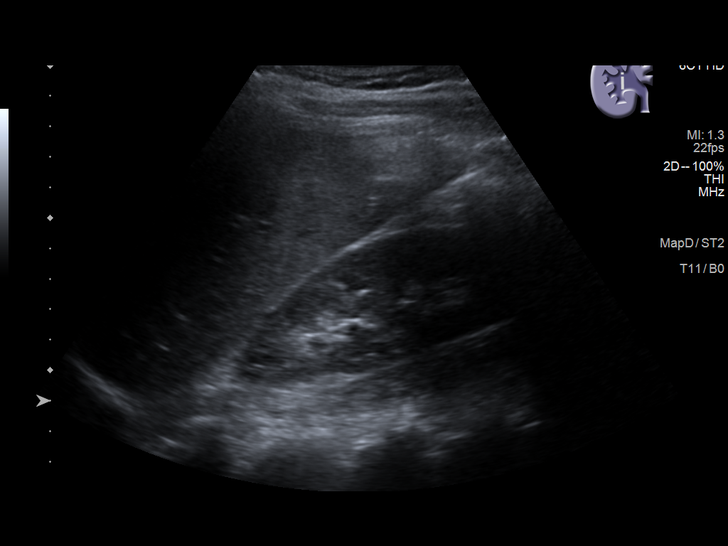
[im 4/37]
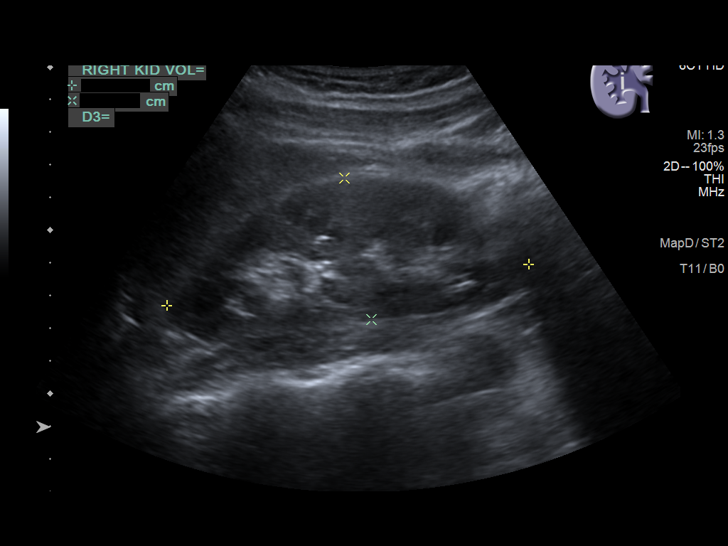
[im 7/37]
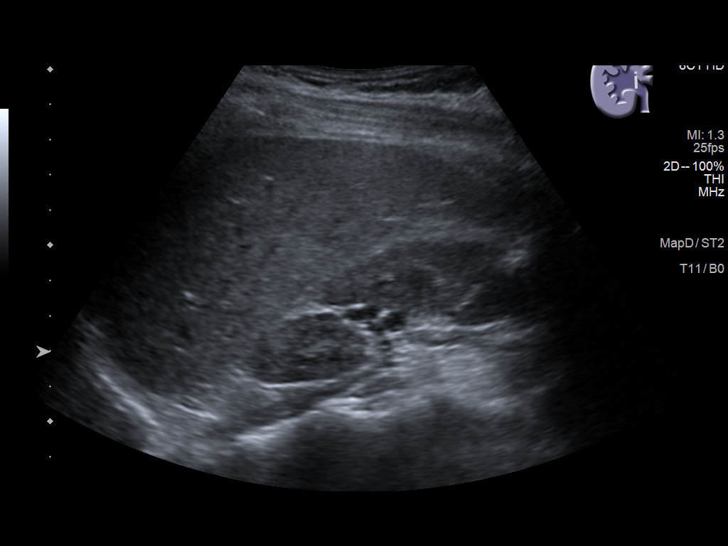
[im 10/37]
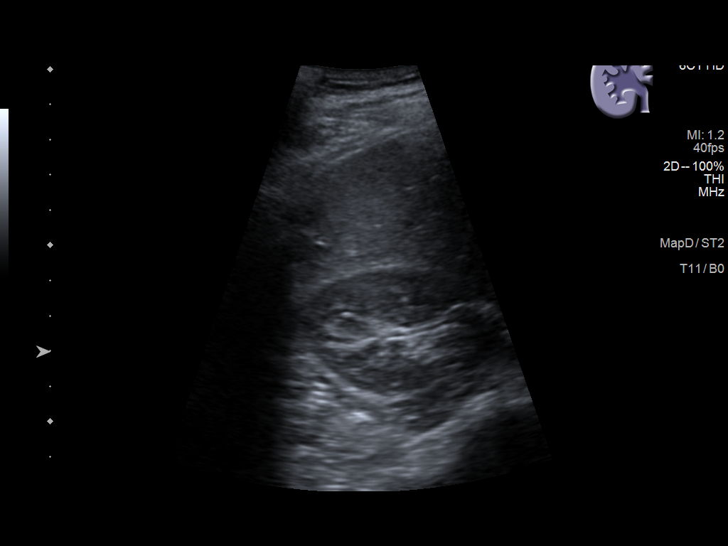
[im 13/37]
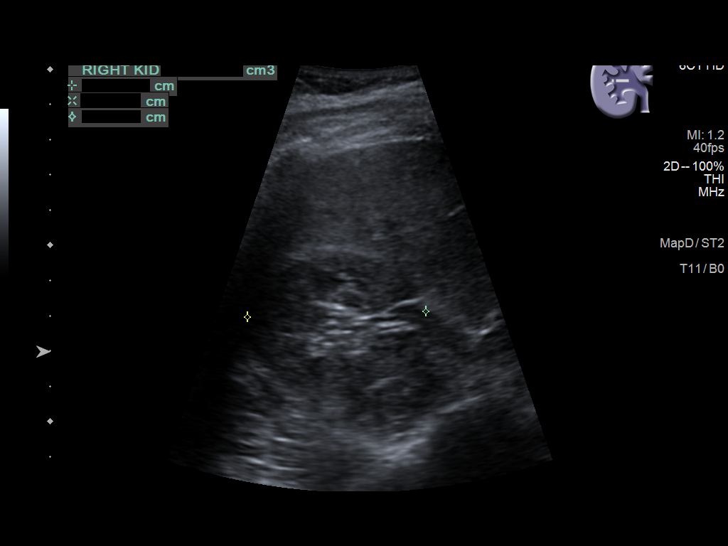
[im 14/37]
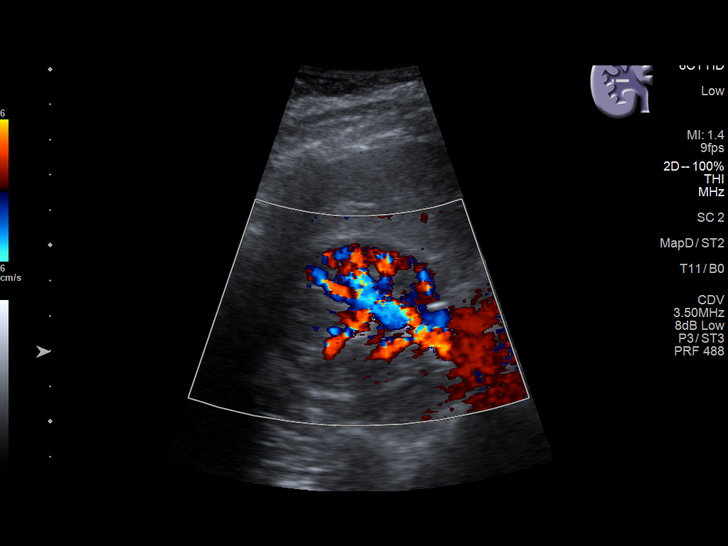
[im 17/37]
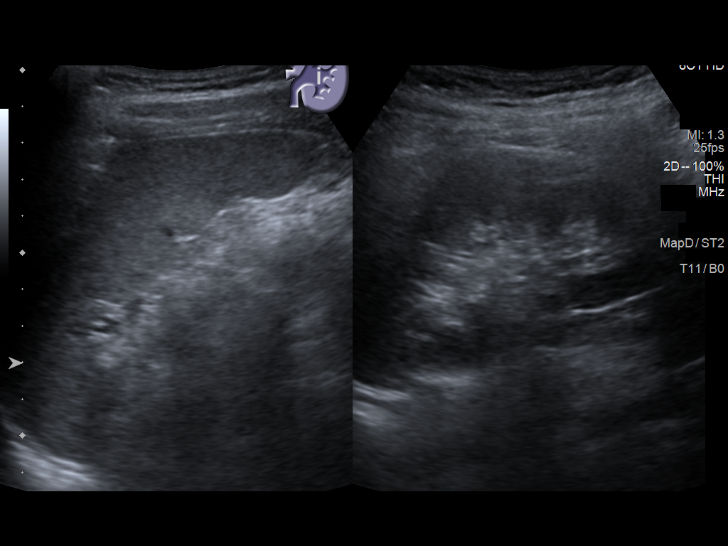
[im 20/37]
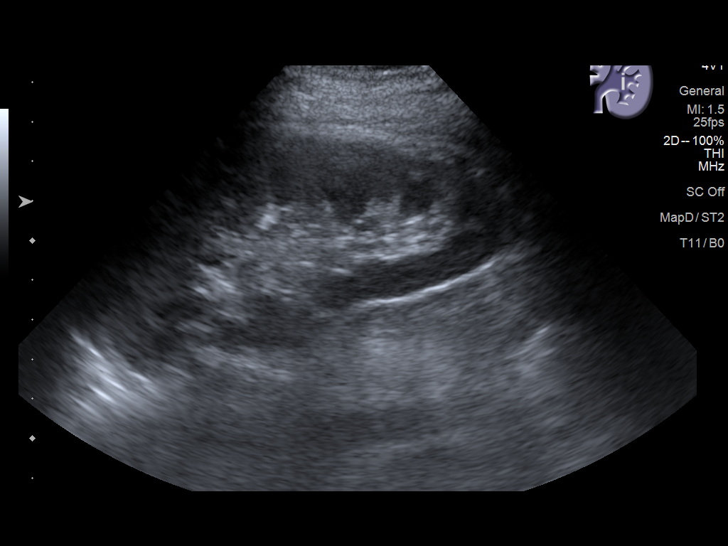
[im 23/37]
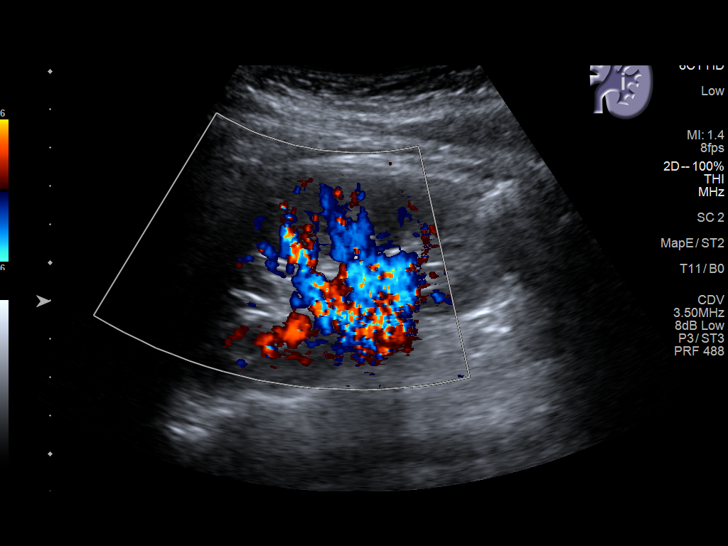
[im 25/37]
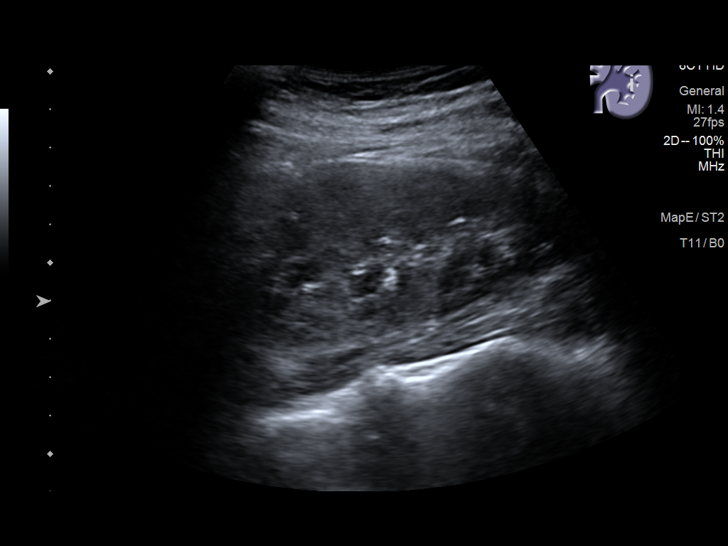
[im 28/37]
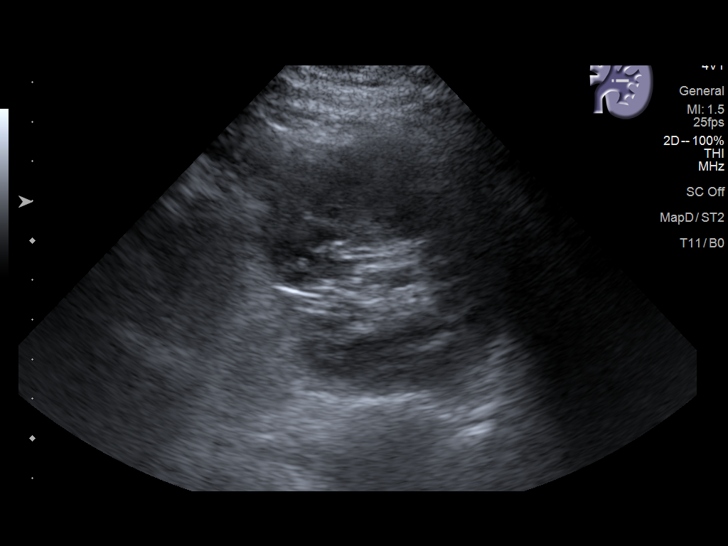
[im 31/37]
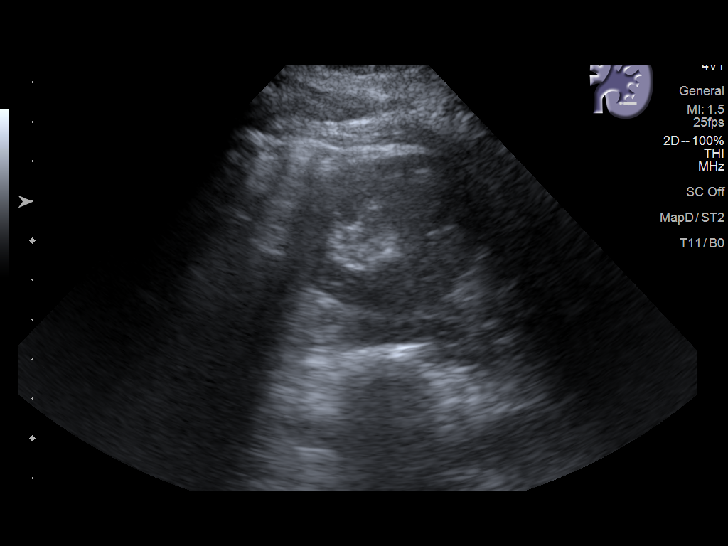
[im 34/37]
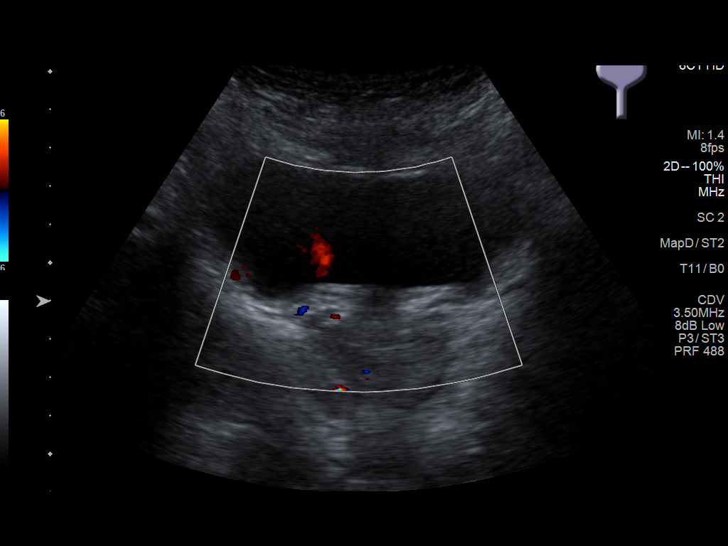
[im 37/37]
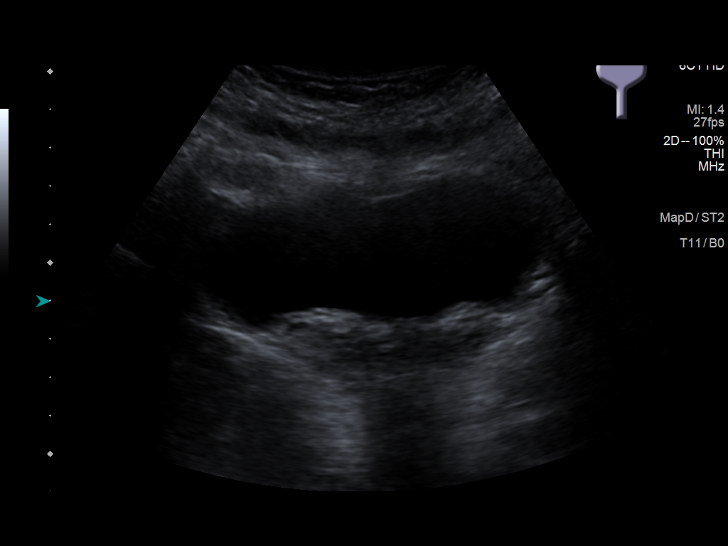

[14 of 25 positions shown; findings below may reference images not displayed]

FINDINGS: Right Kidney:

Renal measurements: 11.1 x 4.4 x 5.1 cm = volume: 130 mL .
Echogenicity within normal limits. No mass or hydronephrosis
visualized.

Left Kidney:

Renal measurements: 10.8 x 5.2 x 5.7 cm = volume: 168 mL.
Echogenicity within normal limits. No mass or hydronephrosis
visualized.

Bladder:

Appears normal for degree of bladder distention.

Other:

None.
IMPRESSION: Negative examination

## 2021-02-06 ENCOUNTER — Ambulatory Visit: Payer: Self-pay | Admitting: Internal Medicine

## 2021-02-06 NOTE — Progress Notes (Deleted)
Patient ID: Claire Allen, female   DOB: [birth year], 21 y.o.   MRN: [id number] HPI  Patient presents to clinic today to establish care and for for management of the conditions listed below.  Anxiety and Depression: Chronic, managed on Paroxetine and Lorazepam.  She is not currently seeing a therapist or psychiatrist.  She denies SI/HI.  GERD: Triggered by.  She takes as needed with good relief of symptoms.  There is no upper GI on file.  HLD: Her last LDL was 119, triglycerides 115, 11/2020.  She is not taking any cholesterol-lowering medication at this time.  She does not consume a low-fat diet.  Past Medical History:  Diagnosis Date   Acid reflux    Anxiety    Depression    Urinary tract infection     Current Outpatient Medications  Medication Sig Dispense Refill   LORazepam (ATIVAN) 0.5 MG tablet Take 0.25 mg by mouth 3 (three) times daily as needed.     NEOMYCIN-POLYMYXIN-HYDROCORTISONE (CORTISPORIN) 1 % SOLN OTIC solution Apply 1-2 drops to toe BID after soaking 10 mL 1   PARoxetine (PAXIL) 20 MG tablet Take 20 mg by mouth daily.     TRI-LO-MARZIA 0.18/0.215/0.25 MG-25 MCG tab Take 1 tablet by mouth daily.     No current facility-administered medications for this visit.    No Known Allergies  Family History  Problem Relation Age of Onset   Heart attack Maternal Grandmother    Hypertension Maternal Grandmother    Hypertension Maternal Grandfather    Endometriosis Mother    Hypertension Father    Hypertension Maternal Uncle    Heart attack Maternal Uncle     Social History   Socioeconomic History   Marital status: Single    Spouse name: Not on file   Number of children: Not on file   Years of education: Not on file   Highest education level: Not on file  Occupational History   Not on file  Tobacco Use   Smoking status: Never   Smokeless tobacco: Never  Vaping Use   Vaping Use: Never used  Substance and Sexual Activity   Alcohol use: Yes    Comment: social    Drug use: No   Sexual activity: Yes    Birth control/protection: Pill  Other Topics Concern   Not on file  Social History Narrative   Not on file   Social Determinants of Health   Financial Resource Strain: Not on file  Food Insecurity: Not on file  Transportation Needs: Not on file  Physical Activity: Not on file  Stress: Not on file  Social Connections: Not on file  Intimate Partner Violence: Not on file    ROS:  Constitutional: Denies fever, malaise, fatigue, headache or abrupt weight changes.  HEENT: Denies eye pain, eye redness, ear pain, ringing in the ears, wax buildup, runny nose, nasal congestion, bloody nose, or sore throat. Respiratory: Denies difficulty breathing, shortness of breath, cough or sputum production.   Cardiovascular: Denies chest pain, chest tightness, palpitations or swelling in the hands or feet.  Gastrointestinal: Patient reports intermittent reflux.  Denies abdominal pain, bloating, constipation, diarrhea or blood in the stool.  GU: Denies frequency, urgency, pain with urination, blood in urine, odor or discharge. Musculoskeletal: Denies decrease in range of motion, difficulty with gait, muscle pain or joint pain and swelling.  Skin: Denies redness, rashes, lesions or ulcercations.  Neurological: Denies dizziness, difficulty with memory, difficulty with speech or problems with balance and coordination.  Psych: Patient  has a history of anxiety and depression.  Denies SI/HI.  No other specific complaints in a complete review of systems (except as listed in HPI above).  PE:  There were no vitals taken for this visit. Wt Readings from Last 3 Encounters:  11/23/20 106 lb (48.1 kg)  10/28/20 105 lb 1.6 oz (47.7 kg)  10/16/20 112 lb 14 oz (51.2 kg)    General: Appears their stated age, well developed, well nourished in NAD. HEENT: Head: normal shape and size; Eyes: sclera white, no icterus, conjunctiva pink, PERRLA and EOMs intact; Ears: Tm's gray  and intact, normal light reflex;Throat/Mouth: Teeth present, mucosa pink and moist, no lesions or ulcerations noted.  Neck: Neck supple, trachea midline. No masses, lumps or thyromegaly present.  Cardiovascular: Normal rate and rhythm. S1,S2 noted.  No murmur, rubs or gallops noted. No JVD or BLE edema. No carotid bruits noted. Pulmonary/Chest: Normal effort and positive vesicular breath sounds. No respiratory distress. No wheezes, rales or ronchi noted.  Abdomen: Soft and nontender. Normal bowel sounds, no bruits noted. No distention or masses noted. Liver, spleen and kidneys non palpable. Musculoskeletal: Normal range of motion. Strength 5/5 BUE/BLE. No signs of joint swelling. No difficulty with gait.  Neurological: Alert and oriented. Cranial nerves II-XII grossly intact. Coordination normal.  Psychiatric: Mood and affect normal. Behavior is normal. Judgment and thought content normal.   EKG:  BMET    Component Value Date/Time   NA 141 04/08/2013 1049   K 3.8 04/08/2013 1049   CL 106 04/08/2013 1049   CO2 27 (H) 04/08/2013 1049   GLUCOSE 106 (H) 04/08/2013 1049   BUN 11 04/08/2013 1049   CREATININE 0.77 04/08/2013 1049   CALCIUM 8.6 (L) 04/08/2013 1049    Lipid Panel  No results found for: CHOL, TRIG, HDL, CHOLHDL, VLDL, LDLCALC  CBC    Component Value Date/Time   WBC 16.7 (H) 04/08/2013 1049   RBC 4.78 04/08/2013 1049   HGB 14.0 04/08/2013 1049   HCT 42.0 04/08/2013 1049   PLT 269 04/08/2013 1049   MCV 88 04/08/2013 1049   MCH 29.4 04/08/2013 1049   MCHC 33.4 04/08/2013 1049   RDW 13.3 04/08/2013 1049   LYMPHSABS 1.3 04/08/2013 1049   MONOABS 1.2 (H) 04/08/2013 1049   EOSABS 0.0 04/08/2013 1049   BASOSABS 0.0 04/08/2013 1049    Hgb A1C No results found for: HGBA1C   Assessment and Plan:   This visit occurred during the SARS-CoV-2 public health emergency.  Safety protocols were in place, including screening questions prior to the visit, additional usage of  staff PPE, and extensive cleaning of exam room while observing appropriate contact time as indicated for disinfecting solutions.

## 2021-02-15 ENCOUNTER — Encounter: Payer: Self-pay | Admitting: Certified Nurse Midwife

## 2021-05-03 ENCOUNTER — Encounter: Payer: 59 | Admitting: Certified Nurse Midwife

## 2021-05-11 ENCOUNTER — Encounter: Payer: 59 | Admitting: Certified Nurse Midwife

## 2021-08-18 ENCOUNTER — Encounter: Payer: Self-pay | Admitting: Physician Assistant

## 2021-10-13 ENCOUNTER — Ambulatory Visit
Admission: EM | Admit: 2021-10-13 | Discharge: 2021-10-13 | Disposition: A | Discharge status: Home / Self Care | Payer: Self-pay | Attending: Physician Assistant | Admitting: Physician Assistant

## 2021-10-13 DIAGNOSIS — R109 Unspecified abdominal pain: Secondary | ICD-10-CM | POA: Insufficient documentation

## 2021-10-13 DIAGNOSIS — R300 Dysuria: Secondary | ICD-10-CM | POA: Insufficient documentation

## 2021-10-13 LAB — URINALYSIS, MICROSCOPIC (REFLEX): RBC / HPF: >50 RBC/hpf (ref 0–5)

## 2021-10-13 LAB — URINALYSIS, ROUTINE W REFLEX MICROSCOPIC

## 2021-10-13 MED ORDER — CEPHALEXIN 500 MG PO CAPS: 500.0000 mg | ORAL_CAPSULE | Freq: Two times a day (BID) | ORAL | 0 refills | Status: AC

## 2021-10-13 MED ORDER — CEPHALEXIN 500 MG PO CAPS: 500.0000 mg | ORAL_CAPSULE | Freq: Two times a day (BID) | ORAL | 0 refills | Status: DC

## 2021-10-13 NOTE — ED Triage Notes (Signed)
Patient presents to UC for blood in her urine, lower back pain, fevers, chills -- symptoms started this AM.   Patient denies any discharge.   Patient reports that she is on her period.   Patient is not concerned about any stds.

## 2021-10-13 NOTE — Discharge Instructions (Addendum)
Unfortunately your urine test has been skewed by the amount of blood present, it has been sent to the lab to determine if bacteria will grow, if this occurs you will be notified and if any changes need to be made to your medicine and will be done at that time  Begin use of Keflex every morning and every evening for 5 days  You may use over-the-counter Azo to help minimize your symptoms until antibiotic removes bacteria, this medication will turn your urine orange  Increase your fluid intake through use of water  As always practice good hygiene, wiping front to back and avoidance of scented vaginal products to prevent further irritation  If symptoms continue to persist after use of medication or recur please follow-up with urgent care or your primary doctor as needed

## 2021-10-13 NOTE — ED Provider Notes (Signed)
[facility]    CSN: [id number] Arrival date & time: 10/13/21  1704      History   Chief Complaint Chief Complaint  Patient presents with   Hematuria   Dysuria    HPI Claire Allen is a 21 y.o. female.   Patient presents with hematuria, dysuria, urgency, frequency, bilateral lower back pain and chills beginning this morning.  History of reoccurring urinary infections.  Has not attempted treatment of symptoms.  Endorses that she believes symptoms began as she had not been taking her daily cranberry pills.  Currently on menstruation, denies concern for STI.  Denies vaginal discharge, itching or odor, new rash or lesions.   Past Medical History:  Diagnosis Date   Acid reflux    Anxiety    Depression    Urinary tract infection     Patient Active Problem List   Diagnosis Date Noted   Anxiety and depression 06/27/2016   Acid reflux 10/13/2015    Past Surgical History:  Procedure Laterality Date   NO PAST SURGERIES      OB History     Gravida  0   Para  0   Term  0   Preterm  0   AB  0   Living  0      SAB  0   IAB  0   Ectopic  0   Multiple  0   Live Births  0            Home Medications    Prior to Admission medications   Medication Sig Start Date End Date Taking? Authorizing Provider  ARIPiprazole (ABILIFY) 2 MG tablet Take 2 mg by mouth daily. 10/03/21  Yes [provider]  LORazepam (ATIVAN) 0.5 MG tablet Take 0.25 mg by mouth 3 (three) times daily as needed. 11/22/20  Yes [provider]  TRI-LO-MARZIA 0.18/0.215/0.25 MG-25 MCG tab Take 1 tablet by mouth daily. 10/06/20  Yes [provider]  NEOMYCIN-POLYMYXIN-HYDROCORTISONE (CORTISPORIN) 1 % SOLN OTIC solution Apply 1-2 drops to toe BID after soaking 11/16/20   Hyatt, Max T, DPM  PARoxetine (PAXIL) 20 MG tablet Take 20 mg by mouth daily. 11/22/20   [provider]  fluticasone (FLONASE) 50 MCG/ACT nasal spray Place 2 sprays into both  nostrils daily. 12/09/19 12/15/19  Tommie Sams, DO    Family History Family History  Problem Relation Age of Onset   Heart attack Maternal Grandmother    Hypertension Maternal Grandmother    Hypertension Maternal Grandfather    Endometriosis Mother    Hypertension Father    Hypertension Maternal Uncle    Heart attack Maternal Uncle     Social History Social History   Tobacco Use   Smoking status: Never   Smokeless tobacco: Never  Vaping Use   Vaping Use: Never used  Substance Use Topics   Alcohol use: Yes    Comment: social   Drug use: No     Allergies   Patient has no known allergies.   Review of Systems Review of Systems  Genitourinary:  Positive for dysuria, frequency, hematuria and urgency. Negative for decreased urine volume, difficulty urinating, dyspareunia, enuresis, flank pain, genital sores, menstrual problem, pelvic pain, vaginal bleeding, vaginal discharge and vaginal pain.     Physical Exam Triage Vital Signs ED Triage Vitals  Enc Vitals Group     BP 10/13/21 1719 111/80     Pulse Rate 10/13/21 1719 97     Resp --  Temp 10/13/21 1719 98.1 F (36.7 C)     Temp Source 10/13/21 1719 Oral     SpO2 10/13/21 1719 99 %     Weight 10/13/21 1717 130 lb (59 kg)     Height 10/13/21 1717 5\' 1"  (1.549 m)     Head Circumference --      Peak Flow --      Pain Score 10/13/21 1717 8     Pain Loc --      Pain Edu? --      Excl. in [place]? --    No data found.  Updated Vital Signs BP 111/80 (BP Location: Right Arm)   Pulse 97   Temp 98.1 F (36.7 C) (Oral)   Ht 5\' 1"  (1.549 m)   Wt 130 lb (59 kg)   LMP 10/09/2021 (Approximate)   SpO2 99%   BMI 24.56 kg/m   Visual Acuity Right Eye Distance:   Left Eye Distance:   Bilateral Distance:    Right Eye Near:   Left Eye Near:    Bilateral Near:     Physical Exam Constitutional:      Appearance: Normal appearance.  Eyes:     Extraocular Movements: Extraocular movements intact.  Pulmonary:      Effort: Pulmonary effort is normal.  Abdominal:     General: Abdomen is flat. Bowel sounds are normal.     Palpations: Abdomen is soft.     Tenderness: There is abdominal tenderness in the suprapubic area. There is right CVA tenderness and left CVA tenderness.  Skin:    General: Skin is dry.  Neurological:     Mental Status: She is alert and oriented to person, place, and time. Mental status is at baseline.  Psychiatric:        Mood and Affect: Mood normal.        Behavior: Behavior normal.      UC Treatments / Results  Labs (all labs ordered are listed, but only abnormal results are displayed) Labs Reviewed  URINALYSIS, [place] MICROSCOPIC    EKG   Radiology No results found.  Procedures Procedures (including critical care time)  Medications Ordered in UC Medications - No data to display  Initial Impression / Assessment and Plan / UC Course  I have reviewed the triage vital signs and the nursing notes.  Pertinent labs & imaging results that were available during my care of the patient were reviewed by me and considered in my medical decision making (see chart for details).  Bilateral flank pain, dysuria  Urinalysis skewed by amount of blood present, sent for culture, discussed with patient, will prophylactically treat as she is symptomatic and has a history of recurrent infections, Keflex prescribed, recommended over-the-counter analgesics, Pyridium, good hygiene and increase fluid intake for additional supportive measures, may follow-up with her urologist if symptoms persist or worsen Final Clinical Impressions(s) / UC Diagnoses   Final diagnoses:  None   Discharge Instructions   None    ED Prescriptions   None    PDMP not reviewed this encounter.   Hans Eden, NP 10/13/21 1757

## 2021-10-15 LAB — URINE CULTURE: Culture: 20000 — AB

## 2021-10-27 ENCOUNTER — Ambulatory Visit: Payer: Self-pay | Admitting: Physician Assistant

## 2022-01-27 ENCOUNTER — Ambulatory Visit: Payer: Self-pay

## 2022-01-28 ENCOUNTER — Ambulatory Visit: Payer: Self-pay

## 2022-02-16 ENCOUNTER — Ambulatory Visit
Admission: EM | Admit: 2022-02-16 | Discharge: 2022-02-16 | Disposition: A | Discharge status: Home / Self Care | Payer: 59 | Attending: Emergency Medicine | Admitting: Emergency Medicine

## 2022-02-16 ENCOUNTER — Encounter: Payer: Self-pay | Admitting: Emergency Medicine

## 2022-02-16 DIAGNOSIS — N3000 Acute cystitis without hematuria: Secondary | ICD-10-CM | POA: Insufficient documentation

## 2022-02-16 DIAGNOSIS — R300 Dysuria: Secondary | ICD-10-CM | POA: Insufficient documentation

## 2022-02-16 LAB — URINALYSIS, W/ REFLEX TO CULTURE (INFECTION SUSPECTED)
Bilirubin Urine: NEGATIVE
Glucose, UA: NEGATIVE mg/dL
Ketones, ur: 15 mg/dL — AB
Nitrite: NEGATIVE
Protein, ur: NEGATIVE mg/dL
Specific Gravity, Urine: >1.03 — ABNORMAL HIGH (ref 1.005–1.030)
pH: 5.5 (ref 5.0–8.0)

## 2022-02-16 MED ORDER — CEFDINIR 300 MG PO CAPS: 300.0000 mg | ORAL_CAPSULE | Freq: Two times a day (BID) | ORAL | 0 refills | Status: AC

## 2022-02-16 NOTE — ED Triage Notes (Signed)
Patient c/o burning when urinating, urinary frequency and lower back pain that started yesterday.

## 2022-02-16 NOTE — ED Provider Notes (Signed)
[facility]    CSN: [id number] Arrival date & time: 02/16/22  0934      History   Chief Complaint Chief Complaint  Patient presents with   Back Pain   Dysuria    HPI Claire Allen is a 22 y.o. female presenting for onset of dysuria, urinary frequency and urgency since yesterday.  Patient says she had a UTI 1 month go and she was treated at that time with 1 week course of Keflex.  Admits to recurrent UTIs and says that she has multiple a year.  Patient says that she normally takes Keflex when she gets UTIs and it generally works for her.  She denies any fevers.  She has had some lower back discomfort in the center.  Also admits to lower abdominal pressure.  Denies fever, chills, hematuria, vaginal discharge, n/v, weakness.  No other complaints.  HPI  Past Medical History:  Diagnosis Date   Acid reflux    Anxiety    Depression    Urinary tract infection     Patient Active Problem List   Diagnosis Date Noted   Anxiety and depression 06/27/2016   Acid reflux 10/13/2015    Past Surgical History:  Procedure Laterality Date   NO PAST SURGERIES      OB History     Gravida  0   Para  0   Term  0   Preterm  0   AB  0   Living  0      SAB  0   IAB  0   Ectopic  0   Multiple  0   Live Births  0            Home Medications    Prior to Admission medications   Medication Sig Start Date End Date Taking? Authorizing Provider  ARIPiprazole (ABILIFY) 2 MG tablet Take 2 mg by mouth daily. 10/03/21  Yes [provider]  cefdinir (OMNICEF) 300 MG capsule Take 1 capsule (300 mg total) by mouth 2 (two) times daily for 7 days. 02/16/22 02/23/22 Yes Laurene Footman B, PA-C  LORazepam (ATIVAN) 0.5 MG tablet Take 0.25 mg by mouth 3 (three) times daily as needed. 11/22/20  Yes [provider]  TRI-LO-MARZIA 0.18/0.215/0.25 MG-25 MCG tab Take 1 tablet by mouth daily. 10/06/20  Yes [provider]  NEOMYCIN-POLYMYXIN-HYDROCORTISONE  (CORTISPORIN) 1 % SOLN OTIC solution Apply 1-2 drops to toe BID after soaking 11/16/20   Hyatt, Max T, DPM  PARoxetine (PAXIL) 20 MG tablet Take 20 mg by mouth daily. 11/22/20   [provider]  fluticasone (FLONASE) 50 MCG/ACT nasal spray Place 2 sprays into both nostrils daily. 12/09/19 12/15/19  Coral Spikes, DO    Family History Family History  Problem Relation Age of Onset   Heart attack Maternal Grandmother    Hypertension Maternal Grandmother    Hypertension Maternal Grandfather    Endometriosis Mother    Hypertension Father    Hypertension Maternal Uncle    Heart attack Maternal Uncle     Social History Social History   Tobacco Use   Smoking status: Never   Smokeless tobacco: Never  Vaping Use   Vaping Use: Every day  Substance Use Topics   Alcohol use: Yes    Comment: social   Drug use: No     Allergies   Patient has no known allergies.   Review of Systems Review of Systems  Constitutional:  Negative for chills, fatigue and fever.  Gastrointestinal:  Negative for abdominal pain, diarrhea, nausea and vomiting.  Genitourinary:  Positive for dysuria, frequency and urgency. Negative for decreased urine volume, flank pain, hematuria, pelvic pain, vaginal bleeding, vaginal discharge and vaginal pain.  Musculoskeletal:  Positive for back pain.  Skin:  Negative for rash.     Physical Exam Triage Vital Signs ED Triage Vitals  Enc Vitals Group     BP      Pulse      Resp      Temp      Temp src      SpO2      Weight      Height      Head Circumference      Peak Flow      Pain Score      Pain Loc      Pain Edu?      Excl. in [place]?    No data found.  Updated Vital Signs BP 119/81 (BP Location: Left Arm)   Pulse 98   Temp 98 F (36.7 C) (Oral)   Resp 14   Ht 5' 1"$  (1.549 m)   Wt 130 lb (59 kg)   LMP 02/09/2022 (Approximate)   SpO2 99%   BMI 24.56 kg/m      Physical Exam Vitals and nursing note reviewed.  Constitutional:       General: She is not in acute distress.    Appearance: Normal appearance. She is not ill-appearing or toxic-appearing.  HENT:     Head: Normocephalic and atraumatic.  Eyes:     General: No scleral icterus.       Right eye: No discharge.        Left eye: No discharge.     Conjunctiva/sclera: Conjunctivae normal.  Cardiovascular:     Rate and Rhythm: Normal rate and regular rhythm.     Heart sounds: Normal heart sounds.  Pulmonary:     Effort: Pulmonary effort is normal. No respiratory distress.     Breath sounds: Normal breath sounds.  Abdominal:     Palpations: Abdomen is soft.     Tenderness: There is abdominal tenderness (mild suprapubic TTP). There is no right CVA tenderness or left CVA tenderness.  Musculoskeletal:     Cervical back: Neck supple.  Skin:    General: Skin is dry.  Neurological:     General: No focal deficit present.     Mental Status: She is alert. Mental status is at baseline.     Motor: No weakness.     Gait: Gait normal.  Psychiatric:        Mood and Affect: Mood normal.        Behavior: Behavior normal.        Thought Content: Thought content normal.      UC Treatments / Results  Labs (all labs ordered are listed, but only abnormal results are displayed) Labs Reviewed  URINALYSIS, W/ REFLEX TO CULTURE (INFECTION SUSPECTED) - Abnormal; Notable for the following components:      Result Value   APPearance CLOUDY (*)    Specific Gravity, Urine >1.030 (*)    Hgb urine dipstick LARGE (*)    Ketones, ur 15 (*)    Leukocytes,Ua SMALL (*)    Bacteria, UA FEW (*)    All other components within normal limits  URINE CULTURE    EKG   Radiology No results found.  Procedures Procedures (including critical care time)  Medications Ordered in UC Medications - No data to display  Initial Impression / Assessment and Plan / UC Course  I have reviewed the triage vital signs and the nursing notes.  Pertinent labs & imaging results that were available  during my care of the patient were reviewed by me and considered in my medical decision making (see chart for details).  22 year old female returning for dysuria, urgency and frequency since yesterday. Treated with Keflex for UTI about a month ago.  Today, she is afebrile and overall well-appearing.  Mild suprapubic tenderness palpation.  No CVA tenderness.  UA today shows cloudy appearance, large hemoglobin, small leukocytes. Urine culture sent. Treating patient with cefdinir at this time since she was treated with Keflex about a month ago and I wanted to choose something different but similar enough since she does well with cephalosporins.  Advised her to increase rest and fluid intake. Urine culture sent. Will amend treatment based on culture.  ED precautions reviewed.  Final Clinical Impressions(s) / UC Diagnoses   Final diagnoses:  Acute cystitis without hematuria  Dysuria     Discharge Instructions      UTI: Based on either symptoms or urinalysis, you may have a urinary tract infection. We will send the urine for culture and call with results in a few days. Begin antibiotics at this time. Your symptoms should be much improved over the next 2-3 days. Increase rest and fluid intake. If for some reason symptoms are worsening or not improving after a couple of days or the urine culture determines the antibiotics you are taking will not treat the infection, the antibiotics may be changed. Return or go to ER for fever, back pain, worsening urinary pain, discharge, increased blood in urine. May take Tylenol or Motrin OTC for pain relief or consider AZO if no contraindications     ED Prescriptions     Medication Sig Dispense Auth. Provider   cefdinir (OMNICEF) 300 MG capsule Take 1 capsule (300 mg total) by mouth 2 (two) times daily for 7 days. 14 capsule Danton Clap, PA-C      PDMP not reviewed this encounter.      Danton Clap, PA-C 02/16/22 1038

## 2022-02-16 NOTE — Discharge Instructions (Addendum)

## 2022-02-18 LAB — URINE CULTURE: Culture: 10000 — AB

## 2022-04-24 ENCOUNTER — Ambulatory Visit
Admission: EM | Admit: 2022-04-24 | Discharge: 2022-04-24 | Disposition: A | Discharge status: Home / Self Care | Payer: 59 | Attending: Internal Medicine | Admitting: Internal Medicine

## 2022-04-24 DIAGNOSIS — R052 Subacute cough: Secondary | ICD-10-CM | POA: Diagnosis not present

## 2022-04-24 DIAGNOSIS — K2901 Acute gastritis with bleeding: Secondary | ICD-10-CM

## 2022-04-24 MED ORDER — ALBUTEROL SULFATE HFA 108 (90 BASE) MCG/ACT IN AERS: 2.0000 | INHALATION_SPRAY | Freq: Four times a day (QID) | RESPIRATORY_TRACT | 2 refills | Status: AC | PRN

## 2022-04-24 MED ORDER — PANTOPRAZOLE SODIUM 20 MG PO TBEC: 20.0000 mg | DELAYED_RELEASE_TABLET | Freq: Every day | ORAL | 0 refills | Status: AC

## 2022-04-24 MED ORDER — BENZONATATE 100 MG PO CAPS: 100.0000 mg | ORAL_CAPSULE | Freq: Three times a day (TID) | ORAL | 0 refills | Status: DC | PRN

## 2022-04-24 NOTE — ED Provider Notes (Signed)
[facility]    CSN: [id number] Arrival date & time: 04/24/22  1455      History   Chief Complaint Chief Complaint  Patient presents with   Emesis    HPI Claire Allen is a 22 y.o. female urgent care with complaints of cough of 2 months duration.  Patient has been vaping while now.  She denies any chest tightness or wheezing.  She has a cough which is not productive of sputum.  Cough is of moderate severity and associated with chest and back pain.  No known relieving factors.  No weight loss, night sweats.  No history of asthma  She will also complains about vomiting blood a few days ago.  She was drinking alcohol when the event happened.  She denies any epigastric pain.  She had 1 episode of emesis.  No dizziness, blurry vision, abdominal distention or changes in bowel movement.  No fever or chills.  She denies any nausea at this time.   HPI  Past Medical History:  Diagnosis Date   Acid reflux    Anxiety    Depression    Urinary tract infection     Patient Active Problem List   Diagnosis Date Noted   Anxiety and depression 06/27/2016   Acid reflux 10/13/2015    Past Surgical History:  Procedure Laterality Date   NO PAST SURGERIES      OB History     Gravida  0   Para  0   Term  0   Preterm  0   AB  0   Living  0      SAB  0   IAB  0   Ectopic  0   Multiple  0   Live Births  0            Home Medications    Prior to Admission medications   Medication Sig Start Date End Date Taking? Authorizing Provider  albuterol (VENTOLIN HFA) 108 (90 Base) MCG/ACT inhaler Inhale 2 puffs into the lungs every 6 (six) hours as needed for wheezing or shortness of breath. 04/24/22  Yes Bently Morath, Britta Mccreedy, MD  benzonatate (TESSALON) 100 MG capsule Take 1 capsule (100 mg total) by mouth 3 (three) times daily as needed for cough. 04/24/22  Yes Alexande Sheerin, Britta Mccreedy, MD  pantoprazole (PROTONIX) 20 MG tablet Take 1 tablet (20 mg total) by mouth daily.  04/24/22  Yes Zahniya Zellars, Britta Mccreedy, MD  ARIPiprazole (ABILIFY) 2 MG tablet Take 2 mg by mouth daily. 10/03/21   [provider]  LORazepam (ATIVAN) 0.5 MG tablet Take 0.25 mg by mouth 3 (three) times daily as needed. 11/22/20   [provider]  NEOMYCIN-POLYMYXIN-HYDROCORTISONE (CORTISPORIN) 1 % SOLN OTIC solution Apply 1-2 drops to toe BID after soaking 11/16/20   Hyatt, Max T, DPM  PARoxetine (PAXIL) 20 MG tablet Take 20 mg by mouth daily. 11/22/20   [provider]  TRI-LO-MARZIA 0.18/0.215/0.25 MG-25 MCG tab Take 1 tablet by mouth daily. 10/06/20   [provider]  fluticasone (FLONASE) 50 MCG/ACT nasal spray Place 2 sprays into both nostrils daily. 12/09/19 12/15/19  Tommie Sams, DO    Family History Family History  Problem Relation Age of Onset   Heart attack Maternal Grandmother    Hypertension Maternal Grandmother    Hypertension Maternal Grandfather    Endometriosis Mother    Hypertension Father    Hypertension Maternal Uncle    Heart attack Maternal Uncle  Social History Social History   Tobacco Use   Smoking status: Never   Smokeless tobacco: Never  Vaping Use   Vaping Use: Every day   Devices: currently trying to quit.  Substance Use Topics   Alcohol use: Yes    Comment: social   Drug use: No     Allergies   Patient has no known allergies.   Review of Systems Review of Systems As per HPI  Physical Exam Triage Vital Signs ED Triage Vitals  Enc Vitals Group     BP 04/24/22 1506 103/61     Pulse Rate 04/24/22 1504 94     Resp 04/24/22 1504 17     Temp 04/24/22 1504 98.3 F (36.8 C)     Temp Source 04/24/22 1504 Oral     SpO2 04/24/22 1504 100 %     Weight --      Height --      Head Circumference --      Peak Flow --      Pain Score 04/24/22 1502 0     Pain Loc --      Pain Edu? --      Excl. in GC? --    No data found.  Updated Vital Signs BP 103/61   Pulse 94   Temp 98.3 F (36.8 C) (Oral)   Resp 17    LMP 03/25/2022 (Approximate)   SpO2 100%   Visual Acuity Right Eye Distance:   Left Eye Distance:   Bilateral Distance:    Right Eye Near:   Left Eye Near:    Bilateral Near:     Physical Exam Vitals and nursing note reviewed.  Constitutional:      General: She is not in acute distress.    Appearance: Normal appearance. She is not ill-appearing.  HENT:     Right Ear: Tympanic membrane normal.     Left Ear: Tympanic membrane normal.     Mouth/Throat:     Mouth: Mucous membranes are moist.     Pharynx: No posterior oropharyngeal erythema.  Cardiovascular:     Rate and Rhythm: Normal rate and regular rhythm.     Pulses: Normal pulses.     Heart sounds: Normal heart sounds.  Pulmonary:     Effort: Pulmonary effort is normal.     Breath sounds: Normal breath sounds.  Abdominal:     General: Bowel sounds are normal.     Palpations: Abdomen is soft.  Neurological:     Mental Status: She is alert.      UC Treatments / Results  Labs (all labs ordered are listed, but only abnormal results are displayed) Labs Reviewed - No data to display  EKG   Radiology No results found.  Procedures Procedures (including critical care time)  Medications Ordered in UC Medications - No data to display  Initial Impression / Assessment and Plan / UC Course  I have reviewed the triage vital signs and the nursing notes.  Pertinent labs & imaging results that were available during my care of the patient were reviewed by me and considered in my medical decision making (see chart for details).     1.  Subacute cough likely secondary to vaping: Albuterol inhaler Cessation advice given Tessalon Perles as needed for cough Lung exam is reassuring and imaging is not indicated at this time  2.  Acute superficial gastritis secondary to alcohol: Protonix 20 mg orally daily for 14 days Patient is advised to avoid excessive alcohol  use. Return precautions given   Final diagnoses:   Subacute cough  Acute superficial gastritis with hemorrhage   Discharge Instructions   None    ED Prescriptions     Medication Sig Dispense Auth. Provider   albuterol (VENTOLIN HFA) 108 (90 Base) MCG/ACT inhaler Inhale 2 puffs into the lungs every 6 (six) hours as needed for wheezing or shortness of breath. 8 g Leitha Hyppolite, Britta Mccreedy, MD   benzonatate (TESSALON) 100 MG capsule Take 1 capsule (100 mg total) by mouth 3 (three) times daily as needed for cough. 21 capsule Tenita Cue, Britta Mccreedy, MD   pantoprazole (PROTONIX) 20 MG tablet Take 1 tablet (20 mg total) by mouth daily. 14 tablet Burnice Vassel, Britta Mccreedy, MD      PDMP not reviewed this encounter.   Merrilee Jansky, MD 04/24/22 [phone]

## 2022-04-24 NOTE — ED Triage Notes (Signed)
Pt states she has not been vomiting today. She only noticed blood once when 4 days ago.

## 2022-04-24 NOTE — Discharge Instructions (Addendum)
This take medications as directed If you quit vape use you will see an improvement in your respiratory symptoms If you have pain please take Tylenol as needed for pain Return to urgent care if symptoms worsen.

## 2022-04-24 NOTE — ED Triage Notes (Signed)
Pt presents with c/o vomiting blood 4 days ago. Pt states she has had a cough X 2 months and chest pain x 1 month. Pt states she used to vape all day and every day. States she now vapes one a day.

## 2022-05-23 ENCOUNTER — Ambulatory Visit
Admission: EM | Admit: 2022-05-23 | Discharge: 2022-05-23 | Disposition: A | Discharge status: Home / Self Care | Payer: 59 | Attending: Emergency Medicine | Admitting: Emergency Medicine

## 2022-05-23 DIAGNOSIS — N760 Acute vaginitis: Secondary | ICD-10-CM | POA: Diagnosis present

## 2022-05-23 DIAGNOSIS — B9689 Other specified bacterial agents as the cause of diseases classified elsewhere: Secondary | ICD-10-CM | POA: Insufficient documentation

## 2022-05-23 DIAGNOSIS — B3731 Acute candidiasis of vulva and vagina: Secondary | ICD-10-CM | POA: Insufficient documentation

## 2022-05-23 LAB — URINALYSIS, W/ REFLEX TO CULTURE (INFECTION SUSPECTED)
Bilirubin Urine: NEGATIVE
Glucose, UA: NEGATIVE mg/dL
Leukocytes,Ua: NEGATIVE
Nitrite: NEGATIVE
Protein, ur: NEGATIVE mg/dL
Specific Gravity, Urine: >1.03 — ABNORMAL HIGH (ref 1.005–1.030)
pH: 5.5 (ref 5.0–8.0)

## 2022-05-23 LAB — PREGNANCY, URINE: Preg Test, Ur: NEGATIVE

## 2022-05-23 LAB — WET PREP, GENITAL
Sperm: NONE SEEN
Trich, Wet Prep: NONE SEEN
WBC, Wet Prep HPF POC: <10 (ref <10)

## 2022-05-23 MED ORDER — FLUCONAZOLE 150 MG PO TABS: 150.0000 mg | ORAL_TABLET | ORAL | 0 refills | Status: AC

## 2022-05-23 MED ORDER — METRONIDAZOLE 500 MG PO TABS: 500.0000 mg | ORAL_TABLET | Freq: Two times a day (BID) | ORAL | 0 refills | Status: DC

## 2022-05-23 NOTE — ED Provider Notes (Signed)
[facility]    CSN: [id number] Arrival date & time: 05/23/22  1159      History   Chief Complaint Chief Complaint  Patient presents with   Dysuria   Possible Pregnancy    HPI Claire Allen is a 22 y.o. female.   HPI  22 year old female with a history of recurrent urinary tract infections, acid reflux, anxiety, and depression presents for evaluation of low back pain and burning with urination that been going on for the past week.  This is associated with urinary urgency and frequency as well as nausea and vomiting.  She has not seen any blood in her urine and she denies abdominal pain or fever.  The patient is also requesting a pregnancy test because she has not had a menstrual cycle and at least 2 months.  She has taken 5 home pregnancy test at home that have been negative.  She is married and in a monogamous relationship and states she does not have any concerns for sexually transmitted infections.  Past Medical History:  Diagnosis Date   Acid reflux    Anxiety    Depression    Urinary tract infection     Patient Active Problem List   Diagnosis Date Noted   Anxiety and depression 06/27/2016   Acid reflux 10/13/2015    Past Surgical History:  Procedure Laterality Date   NO PAST SURGERIES      OB History     Gravida  0   Para  0   Term  0   Preterm  0   AB  0   Living  0      SAB  0   IAB  0   Ectopic  0   Multiple  0   Live Births  0            Home Medications    Prior to Admission medications   Medication Sig Start Date End Date Taking? Authorizing Provider  ARIPiprazole (ABILIFY) 2 MG tablet Take 2 mg by mouth daily. 10/03/21  Yes [provider]  fluconazole (DIFLUCAN) 150 MG tablet Take 1 tablet (150 mg total) by mouth every 3 (three) days for 3 doses. 05/23/22 05/30/22 Yes Becky Augusta, NP  metroNIDAZOLE (FLAGYL) 500 MG tablet Take 1 tablet (500 mg total) by mouth 2 (two) times daily. 05/23/22  Yes Becky Augusta,  NP  PARoxetine (PAXIL) 20 MG tablet Take 20 mg by mouth daily. 11/22/20  Yes [provider]  TRI-LO-MARZIA 0.18/0.215/0.25 MG-25 MCG tab Take 1 tablet by mouth daily. 10/06/20  Yes [provider]  albuterol (VENTOLIN HFA) 108 (90 Base) MCG/ACT inhaler Inhale 2 puffs into the lungs every 6 (six) hours as needed for wheezing or shortness of breath. 04/24/22   Lamptey, Britta Mccreedy, MD  benzonatate (TESSALON) 100 MG capsule Take 1 capsule (100 mg total) by mouth 3 (three) times daily as needed for cough. 04/24/22   Lamptey, Britta Mccreedy, MD  LORazepam (ATIVAN) 0.5 MG tablet Take 0.25 mg by mouth 3 (three) times daily as needed. 11/22/20   [provider]  NEOMYCIN-POLYMYXIN-HYDROCORTISONE (CORTISPORIN) 1 % SOLN OTIC solution Apply 1-2 drops to toe BID after soaking 11/16/20   Hyatt, Max T, DPM  pantoprazole (PROTONIX) 20 MG tablet Take 1 tablet (20 mg total) by mouth daily. 04/24/22   Merrilee Jansky, MD  fluticasone (FLONASE) 50 MCG/ACT nasal spray Place 2 sprays into both nostrils daily. 12/09/19 12/15/19  Tommie Sams, DO  Family History Family History  Problem Relation Age of Onset   Heart attack Maternal Grandmother    Hypertension Maternal Grandmother    Hypertension Maternal Grandfather    Endometriosis Mother    Hypertension Father    Hypertension Maternal Uncle    Heart attack Maternal Uncle     Social History Social History   Tobacco Use   Smoking status: Never   Smokeless tobacco: Never  Vaping Use   Vaping Use: Every day   Devices: currently trying to quit.  Substance Use Topics   Alcohol use: Yes    Comment: social   Drug use: No     Allergies   Patient has no known allergies.   Review of Systems Review of Systems  Constitutional:  Negative for fever.  Gastrointestinal:  Positive for nausea and vomiting. Negative for abdominal pain.  Genitourinary:  Positive for dysuria, frequency, urgency and vaginal discharge. Negative for hematuria and  vaginal pain.  Musculoskeletal:  Positive for back pain.     Physical Exam Triage Vital Signs ED Triage Vitals [05/23/22 1224]  Enc Vitals Group     BP      Pulse      Resp      Temp      Temp src      SpO2      Weight 145 lb (65.8 kg)     Height 5\' 2"  (1.575 m)     Head Circumference      Peak Flow      Pain Score 0     Pain Loc      Pain Edu?      Excl. in GC?    No data found.  Updated Vital Signs BP 99/68 (BP Location: Right Arm)   Pulse (!) 103   Temp 98.6 F (37 C) (Oral)   Ht 5\' 2"  (1.575 m)   Wt 145 lb (65.8 kg)   LMP  (LMP Unknown)   SpO2 98%   BMI 26.52 kg/m   Visual Acuity Right Eye Distance:   Left Eye Distance:   Bilateral Distance:    Right Eye Near:   Left Eye Near:    Bilateral Near:     Physical Exam Vitals and nursing note reviewed.  Constitutional:      Appearance: Normal appearance. She is not ill-appearing.  HENT:     Head: Normocephalic and atraumatic.  Cardiovascular:     Rate and Rhythm: Normal rate.     Pulses: Normal pulses.     Heart sounds: Normal heart sounds. No murmur heard.    No friction rub. No gallop.  Pulmonary:     Effort: Pulmonary effort is normal.     Breath sounds: Normal breath sounds. No wheezing, rhonchi or rales.  Abdominal:     General: Abdomen is flat.     Palpations: Abdomen is soft.     Tenderness: There is no right CVA tenderness or left CVA tenderness.  Skin:    General: Skin is warm and dry.     Capillary Refill: Capillary refill takes less than 2 seconds.  Neurological:     Mental Status: She is alert.      UC Treatments / Results  Labs (all labs ordered are listed, but only abnormal results are displayed) Labs Reviewed  WET PREP, GENITAL - Abnormal; Notable for the following components:      Result Value   Yeast Wet Prep HPF POC PRESENT (*)    Clue Cells Wet Prep  HPF POC PRESENT (*)    All other components within normal limits  URINALYSIS, W/ REFLEX TO CULTURE (INFECTION SUSPECTED)  - Abnormal; Notable for the following components:   Specific Gravity, Urine >1.030 (*)    Hgb urine dipstick TRACE (*)    Ketones, ur TRACE (*)    Bacteria, UA MANY (*)    All other components within normal limits  PREGNANCY, URINE    EKG   Radiology No results found.  Procedures Procedures (including critical care time)  Medications Ordered in UC Medications - No data to display  Initial Impression / Assessment and Plan / UC Course  I have reviewed the triage vital signs and the nursing notes.  Pertinent labs & imaging results that were available during my care of the patient were reviewed by me and considered in my medical decision making (see chart for details).   Patient is a nontoxic-appearing 88 old female presenting for evaluation of GU and GYN complaints as outlined in HPI above.  Of concern is possible urinary tract infection as she has dysuria, urgency, and frequency.  She does have a history of recurrent UTIs and has been evaluated by urology on 2 separate occasions without any definitive source of her recurrent UTIs.  She reports that she is also not had a menstrual cycle in at least 2 months.  She is prescribed birth control and states that she is regular when she takes the birth control but she is not good about taking it.  She reports that she would typically have a menstrual cycle every month but the length of time between cycles varied.  They would last 5 to 7 days.  She does not have a history of PCOS and denies a family history.  She does not currently see GYN.  I will order a urinalysis, urine pregnancy test, and vaginal wet prep.  Urinalysis shows a high specific gravity of greater than 1.030 along with trace hemoglobin and trace ketones.  It is negative for leukocyte esterase, nitrates, or protein.  Reflex microscopy shows 11-20 RBCs with many bacteria, mucus, and budding yeast.  No WBCs or squamous epithelials seen on the slide.  Urine pregnancy test is  negative.  Vaginal wet prep is positive for both yeast and bacterial vaginosis.  I will discharge patient home with a diagnosis of bacterial vaginosis and vaginal yeast infection and treat her with 500 mg metronidazole twice daily for 7 days for treatment of the BV and Diflucan 150 mg that she takes now and repeat every 3 days for total of 3 doses for treatment of vaginal yeast infection.     Final Clinical Impressions(s) / UC Diagnoses   Final diagnoses:  BV (bacterial vaginosis)  Vaginal yeast infection     Discharge Instructions      Take the Flagyl (metronidazole) 500 mg twice daily for treatment of your bacterial vaginosis.  Avoid alcohol while on the metronidazole as taken together will cause of vomiting.  Bacterial vaginosis is often caused by a imbalance of bacteria in your vaginal vault.  This is sometimes a result of using tampons or hormonal fluctuations during her menstrual cycle.  You if your symptoms are recurrent you can try using a boric acid suppository twice weekly to help maintain the acid-base balance in your vagina vault which could prevent further infection.  You can also try vaginal probiotics to help return normal bacterial balance.   For your vaginal yeast infection I have prescribed you Diflucan.  Take 150 mg  now and repeat dosing every 3 days for total of 3 doses.  Your urine pregnancy test today was negative.  If after resolving the bacterial vaginosis your menstrual cycle does not return I recommend that you make an appointment and follow-up with OB/GYN.     ED Prescriptions     Medication Sig Dispense Auth. Provider   metroNIDAZOLE (FLAGYL) 500 MG tablet Take 1 tablet (500 mg total) by mouth 2 (two) times daily. 14 tablet Becky Augusta, NP   fluconazole (DIFLUCAN) 150 MG tablet Take 1 tablet (150 mg total) by mouth every 3 (three) days for 3 doses. 3 tablet Becky Augusta, NP      PDMP not reviewed this encounter.   Becky Augusta, NP 05/23/22  1306

## 2022-05-23 NOTE — Discharge Instructions (Addendum)
Take the Flagyl (metronidazole) 500 mg twice daily for treatment of your bacterial vaginosis.  Avoid alcohol while on the metronidazole as taken together will cause of vomiting.  Bacterial vaginosis is often caused by a imbalance of bacteria in your vaginal vault.  This is sometimes a result of using tampons or hormonal fluctuations during her menstrual cycle.  You if your symptoms are recurrent you can try using a boric acid suppository twice weekly to help maintain the acid-base balance in your vagina vault which could prevent further infection.  You can also try vaginal probiotics to help return normal bacterial balance.   For your vaginal yeast infection I have prescribed you Diflucan.  Take 150 mg now and repeat dosing every 3 days for total of 3 doses.  Your urine pregnancy test today was negative.  If after resolving the bacterial vaginosis your menstrual cycle does not return I recommend that you make an appointment and follow-up with OB/GYN.

## 2022-05-23 NOTE — ED Triage Notes (Addendum)
Pt presents to UC c/o concerns for UTI, pt also is requesting pregnancy test states she is x2 months late on her period. Pt has taken x5 pregnancy tests at home and all have been negative, pt reports dysuria and lower back pain. Pt states UTI sxs onset x1 week ago. Pt denies any vaginal bleeding, no hematuria, denies any concerns for STDs. Pt unsure of start date of last menstrual.

## 2022-08-02 ENCOUNTER — Ambulatory Visit
Admission: EM | Admit: 2022-08-02 | Discharge: 2022-08-02 | Disposition: A | Discharge status: Home / Self Care | Payer: 59 | Attending: Emergency Medicine | Admitting: Emergency Medicine

## 2022-08-02 DIAGNOSIS — N390 Urinary tract infection, site not specified: Secondary | ICD-10-CM | POA: Insufficient documentation

## 2022-08-02 DIAGNOSIS — B9689 Other specified bacterial agents as the cause of diseases classified elsewhere: Secondary | ICD-10-CM | POA: Diagnosis present

## 2022-08-02 DIAGNOSIS — N760 Acute vaginitis: Secondary | ICD-10-CM | POA: Insufficient documentation

## 2022-08-02 LAB — PREGNANCY, URINE: Preg Test, Ur: NEGATIVE

## 2022-08-02 LAB — WET PREP, GENITAL
Sperm: NONE SEEN
Trich, Wet Prep: NONE SEEN
WBC, Wet Prep HPF POC: <10 — AB (ref <10)
Yeast Wet Prep HPF POC: NONE SEEN

## 2022-08-02 LAB — URINALYSIS, W/ REFLEX TO CULTURE (INFECTION SUSPECTED)
Glucose, UA: NEGATIVE mg/dL
Ketones, ur: NEGATIVE mg/dL
Nitrite: NEGATIVE
Specific Gravity, Urine: >1.03 — ABNORMAL HIGH (ref 1.005–1.030)
pH: 6.5 (ref 5.0–8.0)

## 2022-08-02 LAB — URINE CULTURE

## 2022-08-02 MED ORDER — NITROFURANTOIN MONOHYD MACRO 100 MG PO CAPS: 100.0000 mg | ORAL_CAPSULE | Freq: Two times a day (BID) | ORAL | 0 refills | Status: AC

## 2022-08-02 MED ORDER — PHENAZOPYRIDINE HCL 200 MG PO TABS: 200.0000 mg | ORAL_TABLET | Freq: Three times a day (TID) | ORAL | 0 refills | Status: AC

## 2022-08-02 MED ORDER — FLUCONAZOLE 150 MG PO TABS: 150.0000 mg | ORAL_TABLET | ORAL | 0 refills | Status: AC

## 2022-08-02 MED ORDER — METRONIDAZOLE 500 MG PO TABS: 500.0000 mg | ORAL_TABLET | Freq: Two times a day (BID) | ORAL | 0 refills | Status: AC

## 2022-08-02 NOTE — Discharge Instructions (Addendum)
Take the Flagyl (metronidazole) 500 mg twice daily for treatment of your bacterial vaginosis.  Avoid alcohol while on the metronidazole as taken together will cause of vomiting.  Bacterial vaginosis is often caused by a imbalance of bacteria in your vaginal vault.  This is sometimes a result of using tampons or hormonal fluctuations during her menstrual cycle.  You if your symptoms are recurrent you can try using a boric acid suppository twice weekly to help maintain the acid-base balance in your vagina vault which could prevent further infection.  You can also try vaginal probiotics to help return normal bacterial balance.   Take the Macrobid twice daily for 5 days with food for treatment of urinary tract infection.  Use the Pyridium every 8 hours as needed for urinary discomfort.  This will turn your urine a bright red-orange.  Increase your oral fluid intake so that you increase your urine production and or flushing your urinary system.  Take an over-the-counter probiotic, such as Culturelle-Align-Activia, 1 hour after each dose of antibiotic to prevent diarrhea or yeast infections from forming.  We will culture urine and change the antibiotics if necessary.  If you develop symptoms of the a vaginal yeast infection and such as clumpy white discharge or vaginal itching please start taking the Diflucan 150 mg tablets.  You will take 1 tablet at onset and repeat dosing every 3 days for total of 3 doses.  Return for reevaluation, or see your primary care provider, for any new or worsening symptoms.

## 2022-08-02 NOTE — ED Triage Notes (Signed)
Pt c/o urinary freq,burning,pain & hematuria x7 days. Has tried AZO w/o relief.

## 2022-08-02 NOTE — ED Provider Notes (Signed)
[facility]    CSN: [id number] Arrival date & time: 08/02/22  1409      History   Chief Complaint Chief Complaint  Patient presents with   Dysuria   Hematuria    HPI Claire Allen is a 22 y.o. female.   HPI  22 year old female with a past medical history significant for UTIs, BV, depression, anxiety, and GERD presents for evaluation of 70s worth of urinary symptoms which include dysuria, urgency, frequency, and hematuria.  She has been using Azo without relief of symptoms.  Past Medical History:  Diagnosis Date   Acid reflux    Anxiety    Depression    Urinary tract infection     Patient Active Problem List   Diagnosis Date Noted   Anxiety and depression 06/27/2016   Acid reflux 10/13/2015    Past Surgical History:  Procedure Laterality Date   NO PAST SURGERIES      OB History     Gravida  0   Para  0   Term  0   Preterm  0   AB  0   Living  0      SAB  0   IAB  0   Ectopic  0   Multiple  0   Live Births  0            Home Medications    Prior to Admission medications   Medication Sig Start Date End Date Taking? Authorizing Provider  albuterol (VENTOLIN HFA) 108 (90 Base) MCG/ACT inhaler Inhale 2 puffs into the lungs every 6 (six) hours as needed for wheezing or shortness of breath. 04/24/22  Yes Lamptey, Britta Mccreedy, MD  ARIPiprazole (ABILIFY) 2 MG tablet Take 2 mg by mouth daily. 10/03/21  Yes [provider]  fluconazole (DIFLUCAN) 150 MG tablet Take 1 tablet (150 mg total) by mouth every 3 (three) days for 3 doses. 08/02/22 08/09/22 Yes Becky Augusta, NP  LORazepam (ATIVAN) 0.5 MG tablet Take 0.25 mg by mouth 3 (three) times daily as needed. 11/22/20  Yes [provider]  metroNIDAZOLE (FLAGYL) 500 MG tablet Take 1 tablet (500 mg total) by mouth 2 (two) times daily. 08/02/22  Yes Becky Augusta, NP  NEOMYCIN-POLYMYXIN-HYDROCORTISONE (CORTISPORIN) 1 % SOLN OTIC solution Apply 1-2 drops to toe BID after soaking  11/16/20  Yes Hyatt, Max T, DPM  nitrofurantoin, macrocrystal-monohydrate, (MACROBID) 100 MG capsule Take 1 capsule (100 mg total) by mouth 2 (two) times daily. 08/02/22  Yes Becky Augusta, NP  pantoprazole (PROTONIX) 20 MG tablet Take 1 tablet (20 mg total) by mouth daily. 04/24/22  Yes Lamptey, Britta Mccreedy, MD  PARoxetine (PAXIL) 20 MG tablet Take 20 mg by mouth daily. 11/22/20  Yes [provider]  phenazopyridine (PYRIDIUM) 200 MG tablet Take 1 tablet (200 mg total) by mouth 3 (three) times daily. 08/02/22  Yes Becky Augusta, NP  TRI-LO-MARZIA 0.18/0.215/0.25 MG-25 MCG tab Take 1 tablet by mouth daily. 10/06/20  Yes [provider]  fluticasone (FLONASE) 50 MCG/ACT nasal spray Place 2 sprays into both nostrils daily. 12/09/19 12/15/19  Tommie Sams, DO    Family History Family History  Problem Relation Age of Onset   Heart attack Maternal Grandmother    Hypertension Maternal Grandmother    Hypertension Maternal Grandfather    Endometriosis Mother    Hypertension Father    Hypertension Maternal Uncle    Heart attack Maternal Uncle     Social History Social History   Tobacco Use  Smoking status: Never   Smokeless tobacco: Never  Vaping Use   Vaping status: Every Day   Devices: currently trying to quit.  Substance Use Topics   Alcohol use: Yes    Comment: social   Drug use: No     Allergies   Patient has no known allergies.   Review of Systems Review of Systems  Constitutional:  Negative for fever.  Gastrointestinal:  Positive for abdominal pain, nausea and vomiting.       Suprapubic abdominal pain.  Genitourinary:  Positive for dysuria, frequency, hematuria, urgency, vaginal discharge and vaginal pain.  Musculoskeletal:  Positive for back pain.     Physical Exam Triage Vital Signs ED Triage Vitals  Encounter Vitals Group     BP      Systolic BP Percentile      Diastolic BP Percentile      Pulse      Resp      Temp      Temp src      SpO2       Weight      Height      Head Circumference      Peak Flow      Pain Score      Pain Loc      Pain Education      Exclude from Growth Chart    No data found.  Updated Vital Signs BP 108/77 (BP Location: Left Arm)   Pulse 88   Temp 98.1 F (36.7 C) (Oral)   Resp 16   Ht 5\' 2"  (1.575 m)   Wt 140 lb (63.5 kg)   SpO2 98%   BMI 25.61 kg/m   Visual Acuity Right Eye Distance:   Left Eye Distance:   Bilateral Distance:    Right Eye Near:   Left Eye Near:    Bilateral Near:     Physical Exam Vitals and nursing note reviewed.  Constitutional:      Appearance: Normal appearance. She is not ill-appearing.  HENT:     Head: Normocephalic and atraumatic.  Cardiovascular:     Rate and Rhythm: Normal rate and regular rhythm.     Pulses: Normal pulses.     Heart sounds: Normal heart sounds. No murmur heard.    No friction rub. No gallop.  Pulmonary:     Effort: Pulmonary effort is normal.     Breath sounds: Normal breath sounds. No wheezing, rhonchi or rales.  Abdominal:     General: Abdomen is flat.     Palpations: Abdomen is soft.     Tenderness: There is no abdominal tenderness. There is no right CVA tenderness, guarding or rebound.  Skin:    General: Skin is warm and dry.     Capillary Refill: Capillary refill takes less than 2 seconds.     Findings: No rash.  Neurological:     General: No focal deficit present.     Mental Status: She is alert and oriented to person, place, and time.      UC Treatments / Results  Labs (all labs ordered are listed, but only abnormal results are displayed) Labs Reviewed  WET PREP, GENITAL - Abnormal; Notable for the following components:      Result Value   Clue Cells Wet Prep HPF POC PRESENT (*)    WBC, Wet Prep HPF POC <10 (*)    All other components within normal limits  URINALYSIS, W/ REFLEX TO CULTURE (INFECTION SUSPECTED) - Abnormal; Notable for  the following components:   Specific Gravity, Urine >1.030 (*)    Hgb urine  dipstick TRACE (*)    Bilirubin Urine SMALL (*)    Protein, ur TRACE (*)    Leukocytes,Ua TRACE (*)    Bacteria, UA MANY (*)    All other components within normal limits  URINE CULTURE  PREGNANCY, URINE    EKG   Radiology No results found.  Procedures Procedures (including critical care time)  Medications Ordered in UC Medications - No data to display  Initial Impression / Assessment and Plan / UC Course  I have reviewed the triage vital signs and the nursing notes.  Pertinent labs & imaging results that were available during my care of the patient were reviewed by me and considered in my medical decision making (see chart for details).   Patient is a nontoxic-appearing 22 year old female presenting for evaluation of genitourinary symptoms as outlined in HPI above.  She does endorse a white vaginal discharge with a musky odor along with pain with urination, urinary urgency, frequency, and hematuria.  Those symptoms have been present for last 7 days.  She also had some low back pain and suprapubic abdominal pain along with nausea and vomiting.  No fever.  On exam patient's cardiopulmonary exam is benign and she has no CVA tenderness on exam.  Abdomen is soft, flat, and nontender.  When asked about concerns for STIs she states that she is married and has only been with 1 partner so she does not think there is concern.  I will order a vaginal wet prep, urinalysis, and urine pregnancy test as patient has regular periods.  Urine pregnancy test is negative.  Vaginal wet prep is positive for clue cells.  Urinalysis shows high specific gravity with trace hemoglobin, small bilirubin, trace protein, and trace leukocyte esterase.  Negative for nitrites.  Reflex micro shows 21-50 WBCs with many bacteria, WBC clumps, and mucus.  I will discharge patient home with a diagnosis of bacterial vaginosis and urinary tract infection and treat her bacterial vaginosis with metronidazole 500 mg twice  daily for 7 days.  UTI with Macrobid 100 mg twice daily for 5 days and Pyridium every 8 hours as needed for urinary discomfort.  Return precautions reviewed.  I will also send over prescription for Diflucan preemptively should patient develop a yeast infection secondary to antimicrobial therapy.   Final Clinical Impressions(s) / UC Diagnoses   Final diagnoses:  Lower urinary tract infectious disease  BV (bacterial vaginosis)     Discharge Instructions      Take the Flagyl (metronidazole) 500 mg twice daily for treatment of your bacterial vaginosis.  Avoid alcohol while on the metronidazole as taken together will cause of vomiting.  Bacterial vaginosis is often caused by a imbalance of bacteria in your vaginal vault.  This is sometimes a result of using tampons or hormonal fluctuations during her menstrual cycle.  You if your symptoms are recurrent you can try using a boric acid suppository twice weekly to help maintain the acid-base balance in your vagina vault which could prevent further infection.  You can also try vaginal probiotics to help return normal bacterial balance.   Take the Macrobid twice daily for 5 days with food for treatment of urinary tract infection.  Use the Pyridium every 8 hours as needed for urinary discomfort.  This will turn your urine a bright red-orange.  Increase your oral fluid intake so that you increase your urine production and or flushing your urinary  system.  Take an over-the-counter probiotic, such as Culturelle-Align-Activia, 1 hour after each dose of antibiotic to prevent diarrhea or yeast infections from forming.  We will culture urine and change the antibiotics if necessary.  If you develop symptoms of the a vaginal yeast infection and such as clumpy white discharge or vaginal itching please start taking the Diflucan 150 mg tablets.  You will take 1 tablet at onset and repeat dosing every 3 days for total of 3 doses.  Return for reevaluation,  or see your primary care provider, for any new or worsening symptoms.      ED Prescriptions     Medication Sig Dispense Auth. Provider   nitrofurantoin, macrocrystal-monohydrate, (MACROBID) 100 MG capsule Take 1 capsule (100 mg total) by mouth 2 (two) times daily. 10 capsule Becky Augusta, NP   metroNIDAZOLE (FLAGYL) 500 MG tablet Take 1 tablet (500 mg total) by mouth 2 (two) times daily. 14 tablet Becky Augusta, NP   phenazopyridine (PYRIDIUM) 200 MG tablet Take 1 tablet (200 mg total) by mouth 3 (three) times daily. 6 tablet Becky Augusta, NP   fluconazole (DIFLUCAN) 150 MG tablet Take 1 tablet (150 mg total) by mouth every 3 (three) days for 3 doses. 3 tablet Becky Augusta, NP      PDMP not reviewed this encounter.   Becky Augusta, NP 08/02/22 1500

## 2024-01-03 ENCOUNTER — Ambulatory Visit
Admission: EM | Admit: 2024-01-03 | Discharge: 2024-01-03 | Disposition: A | Discharge status: Home / Self Care | Attending: Family Medicine | Admitting: Family Medicine

## 2024-01-03 ENCOUNTER — Encounter: Payer: Self-pay | Admitting: Emergency Medicine

## 2024-01-03 DIAGNOSIS — R300 Dysuria: Secondary | ICD-10-CM | POA: Insufficient documentation

## 2024-01-03 DIAGNOSIS — Z3201 Encounter for pregnancy test, result positive: Secondary | ICD-10-CM | POA: Diagnosis present

## 2024-01-03 LAB — POCT URINE DIPSTICK
Bilirubin, UA: NEGATIVE
Glucose, UA: NEGATIVE mg/dL
Leukocytes, UA: NEGATIVE
Nitrite, UA: NEGATIVE
Protein Ur, POC: NEGATIVE mg/dL
Spec Grav, UA: 1.01
Urobilinogen, UA: 0.2 U/dL
pH, UA: 6.5

## 2024-01-03 LAB — POCT URINE PREGNANCY: Preg Test, Ur: POSITIVE — AB

## 2024-01-03 NOTE — ED Provider Notes (Signed)
 " [facility]    CSN: [id number] Arrival date & time: 01/03/24  1717      History   Chief Complaint Chief Complaint  Patient presents with   Urinary Frequency    HPI Claire Allen is a 24 y.o. female presents for dysuria.  Patient reports 4 days of urinary frequency with some suprapubic pressure.  Denies burning with urination, urgency, hematuria, fevers, vomiting or flank pain.  Does report multiple positive home pregnancy test and has had some nausea.  Last menstrual cycle was November 29.  No vaginal discharge or bleeding or abdominal cramping.  No other concerns at this time   Urinary Frequency    Past Medical History:  Diagnosis Date   Acid reflux    Anxiety    Depression    Urinary tract infection     Patient Active Problem List   Diagnosis Date Noted   Anxiety and depression 06/27/2016   Acid reflux 10/13/2015    Past Surgical History:  Procedure Laterality Date   NO PAST SURGERIES      OB History     Gravida  1   Para  0   Term  0   Preterm  0   AB  0   Living  0      SAB  0   IAB  0   Ectopic  0   Multiple  0   Live Births  0            Home Medications    Prior to Admission medications  Medication Sig Start Date End Date Taking? Authorizing Provider  lamoTRIgine (LAMICTAL) 25 MG tablet Take 25 mg by mouth daily. 10/22/23  Yes [provider]  albuterol  (VENTOLIN  HFA) 108 (90 Base) MCG/ACT inhaler Inhale 2 puffs into the lungs every 6 (six) hours as needed for wheezing or shortness of breath. 04/24/22   Blaise Aleene KIDD, MD  ARIPiprazole (ABILIFY) 2 MG tablet Take 2 mg by mouth daily. 10/03/21   [provider]  LORazepam (ATIVAN) 0.5 MG tablet Take 0.25 mg by mouth 3 (three) times daily as needed. 11/22/20   [provider]  metroNIDAZOLE  (FLAGYL ) 500 MG tablet Take 1 tablet (500 mg total) by mouth 2 (two) times daily. 08/02/22   Bernardino Ditch, NP  NEOMYCIN -POLYMYXIN-HYDROCORTISONE  (CORTISPORIN) 1 % SOLN OTIC solution Apply 1-2 drops to toe BID after soaking 11/16/20   Hyatt, Max T, DPM  nitrofurantoin , macrocrystal-monohydrate, (MACROBID ) 100 MG capsule Take 1 capsule (100 mg total) by mouth 2 (two) times daily. 08/02/22   Bernardino Ditch, NP  pantoprazole  (PROTONIX ) 20 MG tablet Take 1 tablet (20 mg total) by mouth daily. 04/24/22   Blaise Aleene KIDD, MD  PARoxetine (PAXIL) 20 MG tablet Take 20 mg by mouth daily. 11/22/20   [provider]  phenazopyridine  (PYRIDIUM ) 200 MG tablet Take 1 tablet (200 mg total) by mouth 3 (three) times daily. 08/02/22   Bernardino Ditch, NP  TRI-LO-MARZIA 0.18/0.215/0.25 MG-25 MCG tab Take 1 tablet by mouth daily. 10/06/20   [provider]  fluticasone  (FLONASE ) 50 MCG/ACT nasal spray Place 2 sprays into both nostrils daily. 12/09/19 12/15/19  Cook, Jayce G, DO    Family History Family History  Problem Relation Age of Onset   Heart attack Maternal Grandmother    Hypertension Maternal Grandmother    Hypertension Maternal Grandfather    Endometriosis Mother    Hypertension Father    Hypertension Maternal Uncle    Heart attack Maternal  Uncle     Social History Social History[1]   Allergies   Patient has no known allergies.   Review of Systems Review of Systems  Genitourinary:  Positive for frequency.     Physical Exam Triage Vital Signs ED Triage Vitals  Encounter Vitals Group     BP 01/03/24 1804 106/66     Girls Systolic BP Percentile --      Girls Diastolic BP Percentile --      Boys Systolic BP Percentile --      Boys Diastolic BP Percentile --      Pulse Rate 01/03/24 1804 63     Resp 01/03/24 1804 14     Temp 01/03/24 1804 98.1 F (36.7 C)     Temp Source 01/03/24 1804 Oral     SpO2 01/03/24 1804 99 %     Weight 01/03/24 1801 139 lb 15.9 oz (63.5 kg)     Height 01/03/24 1801 5' 2 (1.575 m)     Head Circumference --      Peak Flow --      Pain Score 01/03/24 1801 7     Pain Loc --      Pain  Education --      Exclude from Growth Chart --    No data found.  Updated Vital Signs BP 106/66 (BP Location: Left Arm)   Pulse 63   Temp 98.1 F (36.7 C) (Oral)   Resp 14   Ht 5' 2 (1.575 m)   Wt 139 lb 15.9 oz (63.5 kg)   LMP 11/30/2023 (Approximate)   SpO2 99%   BMI 25.60 kg/m   Visual Acuity Right Eye Distance:   Left Eye Distance:   Bilateral Distance:    Right Eye Near:   Left Eye Near:    Bilateral Near:     Physical Exam Vitals and nursing note reviewed.  Constitutional:      Appearance: Normal appearance.  HENT:     Head: Normocephalic and atraumatic.  Eyes:     Pupils: Pupils are equal, round, and reactive to light.  Cardiovascular:     Rate and Rhythm: Normal rate.  Pulmonary:     Effort: Pulmonary effort is normal.  Abdominal:     Tenderness: There is no right CVA tenderness or left CVA tenderness.  Skin:    General: Skin is warm and dry.  Neurological:     General: No focal deficit present.     Mental Status: She is alert and oriented to person, place, and time.  Psychiatric:        Mood and Affect: Mood normal.        Behavior: Behavior normal.      UC Treatments / Results  Labs (all labs ordered are listed, but only abnormal results are displayed) Labs Reviewed  POCT URINE PREGNANCY - Abnormal; Notable for the following components:      Result Value   Preg Test, Ur Positive (*)    All other components within normal limits  POCT URINE DIPSTICK - Abnormal; Notable for the following components:   Ketones, POC UA small (15) (*)    Blood, UA trace-lysed (*)    All other components within normal limits  URINE CULTURE    EKG   Radiology No results found.  Procedures Procedures (including critical care time)  Medications Ordered in UC Medications - No data to display  Initial Impression / Assessment and Plan / UC Course  I have reviewed the triage vital  signs and the nursing notes.  Pertinent labs & imaging results that were  available during my care of the patient were reviewed by me and considered in my medical decision making (see chart for details).     Reviewed exam and symptoms with patient.  Positive urine hCG.  UA negative for UTI, will culture given symptoms and contact if positive.  Advised establish with OB as soon as possible for prenatal care and to increase fluids and rest.  ER precautions reviewed Final Clinical Impressions(s) / UC Diagnoses   Final diagnoses:  Dysuria  Positive urine pregnancy test     Discharge Instructions      The clinical send your urine for a culture and contact you if this is positive.  Focus on hydration and rest.  Please establish with an OB as soon as possible.  Please go to the ER for any worsening symptoms.  Hope you feel better soon!    ED Prescriptions   None    PDMP not reviewed this encounter.    [1]  Social History Tobacco Use   Smoking status: Never   Smokeless tobacco: Never  Vaping Use   Vaping status: Every Day   Devices: currently trying to quit.  Substance Use Topics   Alcohol use: Yes    Comment: social   Drug use: No     Loreda Myla SAUNDERS, NP 01/03/24 1823  "

## 2024-01-03 NOTE — ED Triage Notes (Signed)
 Patient states that she is pregnant. Patient had positive home pregnancy.  Patient c/o urinary frequency and bladder pressure that started 4 days ago.

## 2024-01-03 NOTE — Discharge Instructions (Addendum)
 The clinical send your urine for a culture and contact you if this is positive.  Focus on hydration and rest.  Please establish with an OB as soon as possible.  Please go to the ER for any worsening symptoms.  Hope you feel better soon!

## 2024-01-05 ENCOUNTER — Emergency Department

## 2024-01-05 ENCOUNTER — Emergency Department
Admission: EM | Admit: 2024-01-05 | Discharge: 2024-01-05 | Disposition: A | Discharge status: Home / Self Care | Attending: Emergency Medicine | Admitting: Emergency Medicine

## 2024-01-05 DIAGNOSIS — Z3A01 Less than 8 weeks gestation of pregnancy: Secondary | ICD-10-CM | POA: Diagnosis not present

## 2024-01-05 DIAGNOSIS — O26891 Other specified pregnancy related conditions, first trimester: Secondary | ICD-10-CM | POA: Diagnosis present

## 2024-01-05 DIAGNOSIS — O200 Threatened abortion: Secondary | ICD-10-CM | POA: Insufficient documentation

## 2024-01-05 LAB — URINE CULTURE

## 2024-01-05 LAB — CBC WITH DIFFERENTIAL/PLATELET
Abs Immature Granulocytes: 0.04 K/uL (ref 0.00–0.07)
Basophils Absolute: 0 K/uL (ref 0.0–0.1)
Basophils Relative: 0 %
Eosinophils Absolute: 0.1 K/uL (ref 0.0–0.5)
Eosinophils Relative: 1 %
HCT: 42.6 % (ref 36.0–46.0)
Hemoglobin: 14.3 g/dL (ref 12.0–15.0)
Immature Granulocytes: 0 %
Lymphocytes Relative: 18 %
Lymphs Abs: 2 K/uL (ref 0.7–4.0)
MCH: 31.6 pg (ref 26.0–34.0)
MCHC: 33.6 g/dL (ref 30.0–36.0)
MCV: 94 fL (ref 80.0–100.0)
Monocytes Absolute: 1.2 K/uL — ABNORMAL HIGH (ref 0.1–1.0)
Monocytes Relative: 11 %
Neutro Abs: 8.1 K/uL — ABNORMAL HIGH (ref 1.7–7.7)
Neutrophils Relative %: 70 %
Platelets: 323 K/uL (ref 150–400)
RBC: 4.53 MIL/uL (ref 3.87–5.11)
RDW: 12.2 % (ref 11.5–15.5)
WBC: 11.5 K/uL — ABNORMAL HIGH (ref 4.0–10.5)
nRBC: 0 % (ref 0.0–0.2)

## 2024-01-05 LAB — URINALYSIS, ROUTINE W REFLEX MICROSCOPIC
Bacteria, UA: NONE SEEN
Bilirubin Urine: NEGATIVE
Glucose, UA: NEGATIVE mg/dL
Ketones, ur: NEGATIVE mg/dL
Leukocytes,Ua: NEGATIVE
Nitrite: NEGATIVE
Protein, ur: NEGATIVE mg/dL
Specific Gravity, Urine: 1.03 (ref 1.005–1.030)
pH: 5 (ref 5.0–8.0)

## 2024-01-05 LAB — HCG, QUANTITATIVE, PREGNANCY: hCG, Beta Chain, Quant, S: 51392 m[IU]/mL — ABNORMAL HIGH

## 2024-01-05 NOTE — ED Provider Notes (Signed)
 "  [facility] Provider Note    Event Date/Time   First MD Initiated Contact with Patient 01/05/24 1442     (approximate)   History   Abdominal Pain   HPI  Claire Allen is a 24 y.o. female, G1P0, LMP in late November, with a history of GERD and anxiety who presents with lower abdominal pain for the last 2 days, crampy, moderate intensity, persistent course.  She states that today when she wiped after urinating, there was a small amount of light red blood.  She denies passing clots or tissue.  She has had some nausea and a couple episodes of vomiting.  She also reports some dysuria but states that she was seen in urgent care few days ago and ruled out for UTI.  She has not yet had an initial prenatal visit for this pregnancy.  I reviewed the past medical records.  The patient was seen at [facility] urgent care on 1/2 with dysuria.  UA was negative, so she was not started on any antibiotic.   Physical Exam   Triage Vital Signs: ED Triage Vitals [01/05/24 1315]  Encounter Vitals Group     BP 119/82     Girls Systolic BP Percentile      Girls Diastolic BP Percentile      Boys Systolic BP Percentile      Boys Diastolic BP Percentile      Pulse Rate 95     Resp 18     Temp 98 F (36.7 C)     Temp Source Oral     SpO2 99 %     Weight 138 lb 14.2 oz (63 kg)     Height 5' 2 (1.575 m)     Head Circumference      Peak Flow      Pain Score 7     Pain Loc      Pain Education      Exclude from Growth Chart     Most recent vital signs: Vitals:   01/05/24 1315 01/05/24 1812  BP: 119/82 104/69  Pulse: 95 90  Resp: 18 17  Temp: 98 F (36.7 C) 98.3 F (36.8 C)  SpO2: 99% 100%     General: Alert, well-appearing, no distress.  CV:  Good peripheral perfusion.  Resp:  Normal effort.  Abd:  Soft and nontender.  No distention.  Other:  No jaundice or scleral icterus.  No conjunctival pallor.   ED Results / Procedures / Treatments   Labs (all labs  ordered are listed, but only abnormal results are displayed) Labs Reviewed  CBC WITH DIFFERENTIAL/PLATELET - Abnormal; Notable for the following components:      Result Value   WBC 11.5 (*)    Neutro Abs 8.1 (*)    Monocytes Absolute 1.2 (*)    All other components within normal limits  URINALYSIS, ROUTINE W REFLEX MICROSCOPIC - Abnormal; Notable for the following components:   Color, Urine YELLOW (*)    APPearance HAZY (*)    Hgb urine dipstick SMALL (*)    All other components within normal limits  HCG, QUANTITATIVE, PREGNANCY - Abnormal; Notable for the following components:   hCG, Beta Chain, Quant, S 51,392 (*)    All other components within normal limits  POC URINE PREG, ED  ABO/RH     EKG     RADIOLOGY  US  OB: I independently viewed and interpreted the images; there is an IUP with FHR consistent with 6 weeks  4 days gestation.   PROCEDURES:  Critical Care performed: No  Procedures   MEDICATIONS ORDERED IN ED: Medications - No data to display   IMPRESSION / MDM / ASSESSMENT AND PLAN / ED COURSE  I reviewed the triage vital signs and the nursing notes.  24 year old female with PMH as noted above presents with lower abdominal pain in the context of early pregnancy, with a small amount of bleeding.  Differential diagnosis includes, but is not limited to, threatened miscarriage, ectopic pregnancy, subchorionic hemorrhage, incomplete miscarriage, UTI.  hCG is 51,000.  CBC shows normal hemoglobin.  She is Rh+.  We will obtain an ultrasound for further evaluation.  Patient's presentation is most consistent with acute complicated illness / injury requiring diagnostic workup.  ----------------------------------------- 6:18 PM on 01/05/2024 -----------------------------------------  Ultrasound shows a live IUP consistent with 6 weeks 4 days.  Urinalysis is negative.  This time, the patient is stable for discharge home.  I counseled her on the results of the workup  and answered all of her questions.  She will follow-up with OB/GYN.  I gave strict return precautions, and she expressed understanding.   FINAL CLINICAL IMPRESSION(S) / ED DIAGNOSES   Final diagnoses:  Threatened miscarriage     Rx / DC Orders   ED Discharge Orders     None        Note:  This document was prepared using Dragon voice recognition software and may include unintentional dictation errors.    Jacolyn Pae, MD 01/05/24 1818  "

## 2024-01-05 NOTE — Discharge Instructions (Addendum)
 Return to the ER for new, worsening, or persistent severe abdominal pain or cramping, vaginal bleeding especially if you have to change a pad more than once every few hours, passing large clots or tissue, weakness or lightheadedness, or any other new or worsening symptoms that concern you.  Follow-up with an OB/GYN as soon as possible.

## 2024-01-05 NOTE — ED Triage Notes (Signed)
 Pt presents to the ED via POV from home. Pt reports that she found out that she was pregnant on 12/30. Pt reports LMP was mid-end of November. Pt reports generalized abd pain x2 days. States that today when she wiped there was some light red blood.

## 2024-01-06 ENCOUNTER — Ambulatory Visit (HOSPITAL_COMMUNITY): Payer: Self-pay

## 2024-01-14 LAB — ABO/RH: ABO/RH(D): A POS
